# Patient Record
Sex: Male | Born: 1955 | Race: Black or African American | Hispanic: No | Marital: Married | Smoking: Current every day smoker
Health system: Southern US, Community
[De-identification: ages and names within clinical notes are randomized; demographics above are authoritative.]

## PROBLEM LIST (undated history)

## (undated) DIAGNOSIS — B1920 Unspecified viral hepatitis C without hepatic coma: Secondary | ICD-10-CM

## (undated) DIAGNOSIS — N401 Enlarged prostate with lower urinary tract symptoms: Secondary | ICD-10-CM

## (undated) DIAGNOSIS — N138 Other obstructive and reflux uropathy: Secondary | ICD-10-CM

## (undated) DIAGNOSIS — I10 Essential (primary) hypertension: Secondary | ICD-10-CM

## (undated) HISTORY — DX: Benign prostatic hyperplasia with lower urinary tract symptoms: N13.8

## (undated) HISTORY — PX: CATARACT EXTRACTION W/ INTRAOCULAR LENS IMPLANT: SHX1309

## (undated) HISTORY — PX: TONSILLECTOMY: SUR1361

## (undated) HISTORY — DX: Other obstructive and reflux uropathy: N40.1

---

## 2006-09-07 ENCOUNTER — Ambulatory Visit: Payer: Self-pay | Admitting: Endocrinology

## 2009-06-28 ENCOUNTER — Emergency Department (HOSPITAL_COMMUNITY): Admission: EM | Admit: 2009-06-28 | Discharge: 2009-06-28 | Payer: Self-pay | Admitting: Emergency Medicine

## 2009-07-17 ENCOUNTER — Ambulatory Visit: Payer: Self-pay | Admitting: Gastroenterology

## 2009-08-29 ENCOUNTER — Ambulatory Visit (HOSPITAL_COMMUNITY): Admission: RE | Admit: 2009-08-29 | Discharge: 2009-08-29 | Payer: Self-pay | Admitting: Gastroenterology

## 2009-08-29 ENCOUNTER — Encounter (INDEPENDENT_AMBULATORY_CARE_PROVIDER_SITE_OTHER): Payer: Self-pay | Admitting: Radiology

## 2009-09-18 ENCOUNTER — Ambulatory Visit: Payer: Self-pay | Admitting: Gastroenterology

## 2009-12-14 ENCOUNTER — Emergency Department (HOSPITAL_COMMUNITY): Admission: EM | Admit: 2009-12-14 | Discharge: 2009-12-14 | Payer: Self-pay | Admitting: Emergency Medicine

## 2011-02-18 LAB — APTT: aPTT: 30 seconds (ref 24–37)

## 2011-02-18 LAB — CBC
Hemoglobin: 14.5 g/dL (ref 13.0–17.0)
MCHC: 34.1 g/dL (ref 30.0–36.0)
RBC: 4.45 MIL/uL (ref 4.22–5.81)
RDW: 13.6 % (ref 11.5–15.5)

## 2011-02-18 LAB — PROTIME-INR
INR: 1.01 (ref 0.00–1.49)
Prothrombin Time: 13.2 seconds (ref 11.6–15.2)

## 2011-02-21 LAB — POCT I-STAT, CHEM 8
BUN: 7 mg/dL (ref 6–23)
Chloride: 108 mEq/L (ref 96–112)
HCT: 45 % (ref 39.0–52.0)
Sodium: 138 mEq/L (ref 135–145)
TCO2: 24 mmol/L (ref 0–100)

## 2011-02-21 LAB — DIFFERENTIAL
Basophils Absolute: 0.2 10*3/uL — ABNORMAL HIGH (ref 0.0–0.1)
Basophils Relative: 4 % — ABNORMAL HIGH (ref 0–1)
Eosinophils Relative: 2 % (ref 0–5)
Monocytes Absolute: 0.5 10*3/uL (ref 0.1–1.0)
Neutro Abs: 3 10*3/uL (ref 1.7–7.7)

## 2011-02-21 LAB — CBC: MCHC: 33.6 g/dL (ref 30.0–36.0)

## 2012-04-26 ENCOUNTER — Emergency Department (HOSPITAL_COMMUNITY)
Admission: EM | Admit: 2012-04-26 | Discharge: 2012-04-26 | Disposition: A | Discharge status: Home / Self Care | Payer: Self-pay | Attending: Emergency Medicine | Admitting: Emergency Medicine

## 2012-04-26 ENCOUNTER — Emergency Department (HOSPITAL_COMMUNITY): Payer: Self-pay

## 2012-04-26 ENCOUNTER — Encounter (HOSPITAL_COMMUNITY): Payer: Self-pay | Admitting: Emergency Medicine

## 2012-04-26 DIAGNOSIS — R0789 Other chest pain: Secondary | ICD-10-CM | POA: Insufficient documentation

## 2012-04-26 DIAGNOSIS — I10 Essential (primary) hypertension: Secondary | ICD-10-CM | POA: Insufficient documentation

## 2012-04-26 DIAGNOSIS — F17200 Nicotine dependence, unspecified, uncomplicated: Secondary | ICD-10-CM | POA: Insufficient documentation

## 2012-04-26 HISTORY — DX: Essential (primary) hypertension: I10

## 2012-04-26 LAB — DIFFERENTIAL
Basophils Relative: 0 % (ref 0–1)
Eosinophils Absolute: 0.2 10*3/uL (ref 0.0–0.7)
Eosinophils Relative: 2 % (ref 0–5)
Lymphs Abs: 3.4 10*3/uL (ref 0.7–4.0)
Monocytes Relative: 4 % (ref 3–12)
Neutrophils Relative %: 47 % (ref 43–77)

## 2012-04-26 LAB — CBC
MCH: 31.5 pg (ref 26.0–34.0)
MCHC: 34.9 g/dL (ref 30.0–36.0)
Platelets: 192 10*3/uL (ref 150–400)
RBC: 4.38 MIL/uL (ref 4.22–5.81)
RDW: 13 % (ref 11.5–15.5)
WBC: 7.5 10*3/uL (ref 4.0–10.5)

## 2012-04-26 LAB — BASIC METABOLIC PANEL
BUN: 11 mg/dL (ref 6–23)
Calcium: 9.3 mg/dL (ref 8.4–10.5)
GFR calc Af Amer: >90 mL/min (ref >90)
GFR calc non Af Amer: >90 mL/min (ref >90)
Glucose, Bld: 179 mg/dL — ABNORMAL HIGH (ref 70–99)
Potassium: 3.7 mEq/L (ref 3.5–5.1)
Sodium: 136 mEq/L (ref 135–145)

## 2012-04-26 MED ORDER — HYDROCODONE-ACETAMINOPHEN 5-325 MG PO TABS: 2.0000 | ORAL_TABLET | ORAL | Status: AC | PRN

## 2012-04-26 MED ORDER — OXYCODONE-ACETAMINOPHEN 5-325 MG PO TABS
2.0000 | ORAL_TABLET | Freq: Once | ORAL | Status: AC
  Administered 2012-04-26: 2 via ORAL
  Filled 2012-04-26: qty 2

## 2012-04-26 MED ORDER — IBUPROFEN 600 MG PO TABS: 600.0000 mg | ORAL_TABLET | Freq: Four times a day (QID) | ORAL | Status: AC | PRN

## 2012-04-26 MED ORDER — CYCLOBENZAPRINE HCL 10 MG PO TABS: 10.0000 mg | ORAL_TABLET | Freq: Two times a day (BID) | ORAL | Status: AC | PRN

## 2012-04-26 NOTE — ED Notes (Signed)
Pt reports left sided chest pain ongoing x 1 week. Pt reports pain increased last night. Pain radiates down left arm.

## 2012-04-26 NOTE — ED Provider Notes (Signed)
History     CSN: [id number]  Arrival date & time 04/26/12  1412   First MD Initiated Contact with Patient 04/26/12 1804      Chief Complaint  Patient presents with  . Chest Pain     HPI Pt reports left sided chest pain ongoing x 1 week. Pt reports pain increased last night. Pain radiates down left arm. No diaphoresis, n/v.  Pain is made worse when he rolls over in bed or moves his arm.  He also has been complaining of some neck pain and crackling.  Patient has no family history of coronary disease his only known risk factors hypertension and smoking.  Past Medical History  Diagnosis Date  . Hypertension     Past Surgical History  Procedure Date  . Tonsillectomy     History reviewed. No pertinent family history.  History  Substance Use Topics  . Smoking status: Current Everyday Smoker  . Smokeless tobacco: Not on file  . Alcohol Use: Yes     Occasional      Review of Systems  All other systems reviewed and are negative.    Allergies  Review of patient's allergies indicates no known allergies.  Home Medications   Current Outpatient Rx  Name Route Sig Dispense Refill  . AMLODIPINE BESYLATE 5 MG PO TABS Oral Take 5 mg by mouth daily.    . ADULT MULTIVITAMIN W/MINERALS CH Oral Take 1 tablet by mouth daily.    . CYCLOBENZAPRINE HCL 10 MG PO TABS Oral Take 1 tablet (10 mg total) by mouth 2 (two) times daily as needed for muscle spasms. 20 tablet 0  . HYDROCODONE-ACETAMINOPHEN 5-325 MG PO TABS Oral Take 2 tablets by mouth every 4 (four) hours as needed for pain. 10 tablet 0  . IBUPROFEN 600 MG PO TABS Oral Take 1 tablet (600 mg total) by mouth every 6 (six) hours as needed for pain. 30 tablet 0    BP 119/78  Pulse 74  Temp 98.1 F (36.7 C) (Oral)  Resp 20  SpO2 99%  Physical Exam  Nursing note and vitals reviewed. Constitutional: He is oriented to person, place, and time. He appears well-developed and well-nourished. No distress.  HENT:  Head:  Normocephalic and atraumatic.  Eyes: Pupils are equal, round, and reactive to light.  Neck: Normal range of motion.  Cardiovascular: Normal rate and intact distal pulses.         Date: 04/26/2012  Rate: 84  Rhythm: normal sinus rhythm  QRS Axis: normal  Intervals: normal  ST/T Wave abnormalities: normal  Conduction Disutrbances: none  Narrative Interpretation: unremarkable      Pulmonary/Chest: No respiratory distress.  Abdominal: Normal appearance. He exhibits no distension.  Musculoskeletal: Normal range of motion.       Arms:      Pain is reproducible to movement and palpation  Neurological: He is alert and oriented to person, place, and time. No cranial nerve deficit.  Skin: Skin is warm and dry. No rash noted.  Psychiatric: He has a normal mood and affect. His behavior is normal.    ED Course  Procedures (including critical care time)  Labs Reviewed  BASIC METABOLIC PANEL - Abnormal; Notable for the following:    Glucose, Bld 179 (*)     All other components within normal limits  CBC  DIFFERENTIAL  POCT I-STAT TROPONIN I   Dg Chest 2 View  04/26/2012  *RADIOLOGY REPORT*  Clinical Data: Left chest pain.  CHEST - 2 VIEW  Comparison: PA and lateral chest 12/14/2009.  Findings: Lungs are clear.  Heart size is normal.  No pneumothorax or pleural fluid.  No bony abnormality.  IMPRESSION: Normal chest.  Original Report Authenticated By: Bernadene Bell. D'ALESSIO, M.D.     1. Musculoskeletal chest pain       MDM  After examination of the patient's his pain was not typical of ACS.  Most likely it is musculoskeletal in nature.  Plan symptomatic treatment with followup or return as needed       Nelia Shi, MD 04/26/12 [phone]

## 2012-04-26 NOTE — Discharge Instructions (Signed)
Chest Pain, Nonspecific  It is often hard to give a specific diagnosis for the cause of chest pain. There is always a chance that your pain could be related to something serious, like a heart attack or a blood clot in the lungs. You need to follow up with your caregiver for further evaluation. More lab tests or other studies such as X-rays, electrocardiography, stress testing, or cardiac imaging may be needed to find the cause of your pain.  Most of the time, nonspecific chest pain improves within 2 to 3 days with rest and mild pain medicine. For the next few days, avoid physical exertion or activities that bring on pain. Do not smoke. Avoid drinking alcohol. Call your caregiver for routine follow-up as advised.   SEEK IMMEDIATE MEDICAL CARE IF:   You develop increased chest pain or pain that radiates to the arm, neck, jaw, back, or abdomen.   You develop shortness of breath, increased coughing, or you start coughing up blood.   You have severe back or abdominal pain, nausea, or vomiting.   You develop severe weakness, fainting, fever, or chills.  Document Released: 11/01/2005 Document Revised: 10/21/2011 Document Reviewed: 04/21/2007  ExitCare Patient Information 2012 [employer].

## 2012-10-16 ENCOUNTER — Emergency Department (INDEPENDENT_AMBULATORY_CARE_PROVIDER_SITE_OTHER)
Admission: EM | Admit: 2012-10-16 | Discharge: 2012-10-16 | Disposition: A | Discharge status: Home / Self Care | Payer: Self-pay | Source: Home / Self Care

## 2012-10-16 ENCOUNTER — Encounter (HOSPITAL_COMMUNITY): Payer: Self-pay

## 2012-10-16 DIAGNOSIS — L989 Disorder of the skin and subcutaneous tissue, unspecified: Secondary | ICD-10-CM

## 2012-10-16 HISTORY — DX: Unspecified viral hepatitis C without hepatic coma: B19.20

## 2012-10-16 MED ORDER — TOBRAMYCIN 0.3 % OP OINT
TOPICAL_OINTMENT | OPHTHALMIC | Status: AC
  Filled 2012-10-16: qty 3.5

## 2012-10-16 NOTE — ED Provider Notes (Signed)
CC:   Mole on head  HPI Pt is a 56 y.o. male with mole on head for 3 months, getting bigger. Hit head on car door before it happened - wasn't bleeding at time.  Developed a scab on it after a few weeks. Has been picking at scab over time, causing bleeding and sore/raw feeling.  Gets headaches on top of scalp when "messing" with it. No hx skin cancer personally or in family. Wants to have lesion cut off.    Past Medical History  Diagnosis Date  . Hypertension   . Hepatitis C last year    Has appt for treatment at [facility] in January   Past Surgical History  Procedure Date  . Tonsillectomy    MEDS:  5 mg amlodipine  ALLER:  None  Smoking:  2 ppd.  ALC:  Occasionally (once a month) DRUGS:  None  ROS:  Review of Systems - General ROS: negative for - chills, fatigue, fever, malaise, night sweats or weight loss Ophthalmic ROS: negative for - visual disturbance or change ENT ROS: negative for - tinnitus, vertigo or visual changes Respiratory ROS: negative for - cough or shortness of breath Cardiovascular ROS: negative for - loss of consciousness or shortness of breath Neurological ROS: negative for - confusion, dizziness, seizures or weakness Dermatological ROS: negative for eczema, pruritus and psoriasis  PHYS EXAM:  Filed Vitals:   10/16/12 1844  BP: 121/78  Pulse: 82  Temp: 98.8 F (37.1 C)  Resp: 16   Physical Examination: General appearance - alert, well appearing, and in no distress, oriented to person, place, and time and normal appearing weight Mental status - normal mood, behavior, speech, dress, motor activity, and thought processes Eyes - PERRL, EOMI, conjunctiva clear Mouth - mucous membranes moist, pharynx normal without lesions Neck - supple, no significant adenopathy Chest - clear to auscultation, no wheezes, rales or rhonchi, symmetric air entry Heart - normal rate, regular rhythm, normal S1, S2, no murmurs, rubs, clicks or gallops Extremities - no pedal edema  noted Skin - 1 cm heaped-up, cauliflower-like, flesh-colored lesion on scalp, mid-line, parietal area. Non tender. Wide base/stalk. No erythema or bleeding. HEAD:  NCAT, no pre or post-auricular adenopathy.  NEURO:  No focal deficits  A/P 55 y.o. male with scalp lesion. Likely wart vs keloid but cannot rule out cancer. Patient advised to establish care with family physician at underserved clinic or [facility] for biopsy/excision of lesion for pathology.   Napoleon Form, MD  Napoleon Form, MD 10/16/12 [phone]

## 2012-10-16 NOTE — ED Notes (Signed)
C/o mole on top of head, changing in color and size has been there for approx 3 mos

## 2013-09-30 ENCOUNTER — Emergency Department (HOSPITAL_COMMUNITY): Payer: BC Managed Care – PPO

## 2013-09-30 ENCOUNTER — Encounter (HOSPITAL_COMMUNITY): Payer: Self-pay | Admitting: Emergency Medicine

## 2013-09-30 ENCOUNTER — Emergency Department (HOSPITAL_COMMUNITY)
Admission: EM | Admit: 2013-09-30 | Discharge: 2013-09-30 | Disposition: A | Discharge status: Home / Self Care | Payer: BC Managed Care – PPO | Attending: Emergency Medicine | Admitting: Emergency Medicine

## 2013-09-30 DIAGNOSIS — B1920 Unspecified viral hepatitis C without hepatic coma: Secondary | ICD-10-CM | POA: Insufficient documentation

## 2013-09-30 DIAGNOSIS — W2209XA Striking against other stationary object, initial encounter: Secondary | ICD-10-CM | POA: Insufficient documentation

## 2013-09-30 DIAGNOSIS — R609 Edema, unspecified: Secondary | ICD-10-CM | POA: Insufficient documentation

## 2013-09-30 DIAGNOSIS — Y929 Unspecified place or not applicable: Secondary | ICD-10-CM | POA: Insufficient documentation

## 2013-09-30 DIAGNOSIS — S92919A Unspecified fracture of unspecified toe(s), initial encounter for closed fracture: Secondary | ICD-10-CM | POA: Insufficient documentation

## 2013-09-30 DIAGNOSIS — S92912A Unspecified fracture of left toe(s), initial encounter for closed fracture: Secondary | ICD-10-CM

## 2013-09-30 DIAGNOSIS — I10 Essential (primary) hypertension: Secondary | ICD-10-CM | POA: Insufficient documentation

## 2013-09-30 DIAGNOSIS — Z79899 Other long term (current) drug therapy: Secondary | ICD-10-CM | POA: Insufficient documentation

## 2013-09-30 DIAGNOSIS — Y9389 Activity, other specified: Secondary | ICD-10-CM | POA: Insufficient documentation

## 2013-09-30 DIAGNOSIS — F17200 Nicotine dependence, unspecified, uncomplicated: Secondary | ICD-10-CM | POA: Insufficient documentation

## 2013-09-30 MED ORDER — OXYCODONE-ACETAMINOPHEN 5-325 MG PO TABS: 2.0000 | ORAL_TABLET | ORAL | Status: DC | PRN

## 2013-09-30 MED ORDER — OXYCODONE-ACETAMINOPHEN 5-325 MG PO TABS: 2.0000 | ORAL_TABLET | Freq: Once | ORAL | Status: DC

## 2013-09-30 NOTE — ED Notes (Signed)
Pt c/o injury to left pinky toe 1 week ago. Pt reports hit toe on door. Pt reports area swollen. Pt has been soaking his foot in epsom salt with warm water. Pt reports pain radiates up to left ankle. Pt ambulatory in triage.

## 2013-09-30 NOTE — ED Provider Notes (Signed)
CSN: [id number]     Arrival date & time 09/30/13  1131 History  This chart was scribed for non-physician practitioner, Irish Elders, NP working with Bonnita Levan. Bernette Mayers, MD by Greggory Stallion, ED scribe. This patient was seen in room TR10C/TR10C and the patient's care was started at 12:43 PM.   Chief Complaint  Patient presents with  . Toe Injury   The history is provided by the patient. No language interpreter was used.   HPI Comments: Patrick Chavez is a 57 y.o. male who presents to the Emergency Department complaining of left pinky toe injury that occurred one week ago when he hit his toe on the door. Pt has sudden onset, constant left pinky toe pain that radiates into his left ankle with associated toe swelling. He has been doing epsom salt soaks with little relief. Denies numbness and tingling.   Past Medical History  Diagnosis Date  . Hypertension   . Hepatitis C last year    Has appt for treatment at [facility] in January   Past Surgical History  Procedure Laterality Date  . Tonsillectomy     No family history on file. History  Substance Use Topics  . Smoking status: Current Every Day Smoker  . Smokeless tobacco: Not on file  . Alcohol Use: No    Review of Systems  Musculoskeletal: Positive for arthralgias and joint swelling.  Neurological: Negative for numbness.  All other systems reviewed and are negative.   Allergies  Review of patient's allergies indicates no known allergies.  Home Medications   Current Outpatient Rx  Name  Route  Sig  Dispense  Refill  . amLODipine (NORVASC) 5 MG tablet   Oral   Take 5 mg by mouth daily.         . Multiple Vitamin (MULTIVITAMIN WITH MINERALS) TABS   Oral   Take 1 tablet by mouth daily.          BP 129/80  Pulse 79  Temp(Src) 97.9 F (36.6 C) (Oral)  Resp 18  Ht 6\' 3"  (1.905 m)  Wt 210 lb (95.255 kg)  BMI 26.25 kg/m2  SpO2 98%  Physical Exam  Nursing note and vitals reviewed. Constitutional: He is oriented to  person, place, and time. He appears well-developed and well-nourished. No distress.  HENT:  Head: Normocephalic and atraumatic.  Eyes: EOM are normal.  Neck: Neck supple. No tracheal deviation present.  Cardiovascular: Normal rate.   Pulmonary/Chest: Effort normal. No respiratory distress.  Musculoskeletal: Normal range of motion.  Left foot fifth digit slight edema. Has been ambulatory and wearing shoe.   Neurological: He is alert and oriented to person, place, and time.  Good sensation. No numbness or tingling.   Skin: Skin is warm and dry.  Psychiatric: He has a normal mood and affect. His behavior is normal.    ED Course  Procedures (including critical care time)  DIAGNOSTIC STUDIES: Oxygen Saturation is 98% on RA, normal by my interpretation.    COORDINATION OF CARE: 12:44 PM-Discussed treatment plan which includes xray and pain medication with pt at bedside and pt agreed to plan.  1:10 PM-Alerted pt that xray was positive for a fracture. He will be given a boot and pain medication to go home with.   Labs Review Labs Reviewed - No data to display Imaging Review Dg Foot Complete Left  09/30/2013   CLINICAL DATA:  Fall 1 week ago with pain centered at 5th digit.  EXAM: LEFT FOOT - COMPLETE 3+ VIEW  COMPARISON:  None.  FINDINGS: Transverse fracture involving the proximal phalanx of the the 5th digit. This may have minimal comminution. No intra-articular extension.  IMPRESSION: Fifth proximal phalangeal fracture.   Electronically Signed   By: Jeronimo Greaves M.D.   On: 09/30/2013 12:53    EKG Interpretation   None       MDM   1. Toe fracture, left, closed, initial encounter     Injury to left, 5th metatarsal approx 1 week ago. X-ray; 5th metatarsal fracture, non-displaced. RICE treatment, percocet for increased pain. Ortho boot given to wear as needed. Pt reports that he has already had some improvement prior to today's visit. Plan of care discussed with pt and he  agrees.  I personally performed the services described in this documentation, which was scribed in my presence. The recorded information has been reviewed and is accurate.   Irish Elders, NP 09/30/13 1540

## 2013-10-02 NOTE — ED Provider Notes (Signed)
Medical screening examination/treatment/procedure(s) were performed by non-physician practitioner and as supervising physician I was immediately available for consultation/collaboration.  EKG Interpretation   None         Charles B. Sheldon, MD 10/02/13 0700 

## 2015-04-05 ENCOUNTER — Emergency Department (HOSPITAL_COMMUNITY)
Admission: EM | Admit: 2015-04-05 | Discharge: 2015-04-05 | Disposition: A | Discharge status: Home / Self Care | Payer: BC Managed Care – PPO | Attending: Emergency Medicine | Admitting: Emergency Medicine

## 2015-04-05 ENCOUNTER — Encounter (HOSPITAL_COMMUNITY): Payer: Self-pay | Admitting: *Deleted

## 2015-04-05 ENCOUNTER — Emergency Department (HOSPITAL_COMMUNITY): Payer: BC Managed Care – PPO

## 2015-04-05 DIAGNOSIS — Z720 Tobacco use: Secondary | ICD-10-CM | POA: Insufficient documentation

## 2015-04-05 DIAGNOSIS — Y9389 Activity, other specified: Secondary | ICD-10-CM | POA: Diagnosis not present

## 2015-04-05 DIAGNOSIS — Z79899 Other long term (current) drug therapy: Secondary | ICD-10-CM | POA: Diagnosis not present

## 2015-04-05 DIAGNOSIS — S8992XA Unspecified injury of left lower leg, initial encounter: Secondary | ICD-10-CM | POA: Diagnosis not present

## 2015-04-05 DIAGNOSIS — S39012A Strain of muscle, fascia and tendon of lower back, initial encounter: Secondary | ICD-10-CM

## 2015-04-05 DIAGNOSIS — I10 Essential (primary) hypertension: Secondary | ICD-10-CM | POA: Diagnosis not present

## 2015-04-05 DIAGNOSIS — Y92410 Unspecified street and highway as the place of occurrence of the external cause: Secondary | ICD-10-CM | POA: Diagnosis not present

## 2015-04-05 DIAGNOSIS — Z8619 Personal history of other infectious and parasitic diseases: Secondary | ICD-10-CM | POA: Diagnosis not present

## 2015-04-05 DIAGNOSIS — Y998 Other external cause status: Secondary | ICD-10-CM | POA: Diagnosis not present

## 2015-04-05 DIAGNOSIS — S3992XA Unspecified injury of lower back, initial encounter: Secondary | ICD-10-CM | POA: Diagnosis present

## 2015-04-05 MED ORDER — METHOCARBAMOL 500 MG PO TABS: 500.0000 mg | ORAL_TABLET | Freq: Two times a day (BID) | ORAL | Status: DC

## 2015-04-05 MED ORDER — PREDNISONE 10 MG PO TABS: ORAL_TABLET | ORAL | Status: DC

## 2015-04-05 MED ORDER — IBUPROFEN 800 MG PO TABS: 800.0000 mg | ORAL_TABLET | Freq: Three times a day (TID) | ORAL | Status: DC
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
## 2015-04-05 NOTE — Discharge Instructions (Signed)
Back Pain, Adult Low back pain is very common. About 1 in 5 people have back pain.The cause of low back pain is rarely dangerous. The pain often gets better over time.About half of people with a sudden onset of back pain feel better in just 2 weeks. About 8 in 10 people feel better by 6 weeks.  CAUSES Some common causes of back pain include:  Strain of the muscles or ligaments supporting the spine.  Wear and tear (degeneration) of the spinal discs.  Arthritis.  Direct injury to the back. DIAGNOSIS Most of the time, the direct cause of low back pain is not known.However, back pain can be treated effectively even when the exact cause of the pain is unknown.Answering your caregiver's questions about your overall health and symptoms is one of the most accurate ways to make sure the cause of your pain is not dangerous. If your caregiver needs more information, he or she may order lab work or imaging tests (X-rays or MRIs).However, even if imaging tests show changes in your back, this usually does not require surgery. HOME CARE INSTRUCTIONS For many people, back pain returns.Since low back pain is rarely dangerous, it is often a condition that people can learn to manageon their own.   Remain active. It is stressful on the back to sit or stand in one place. Do not sit, drive, or stand in one place for more than 30 minutes at a time. Take short walks on level surfaces as soon as pain allows.Try to increase the length of time you walk each day.  Do not stay in bed.Resting more than 1 or 2 days can delay your recovery.  Do not avoid exercise or work.Your body is made to move.It is not dangerous to be active, even though your back may hurt.Your back will likely heal faster if you return to being active before your pain is gone.  Pay attention to your body when you bend and lift. Many people have less discomfortwhen lifting if they bend their knees, keep the load close to their bodies,and  avoid twisting. Often, the most comfortable positions are those that put less stress on your recovering back.  Find a comfortable position to sleep. Use a firm mattress and lie on your side with your knees slightly bent. If you lie on your back, put a pillow under your knees.  Only take over-the-counter or prescription medicines as directed by your caregiver. Over-the-counter medicines to reduce pain and inflammation are often the most helpful.Your caregiver may prescribe muscle relaxant drugs.These medicines help dull your pain so you can more quickly return to your normal activities and healthy exercise.  Put ice on the injured area.  Put ice in a plastic bag.  Place a towel between your skin and the bag.  Leave the ice on for 15-20 minutes, 03-04 times a day for the first 2 to 3 days. After that, ice and heat may be alternated to reduce pain and spasms.  Ask your caregiver about trying back exercises and gentle massage. This may be of some benefit.  Avoid feeling anxious or stressed.Stress increases muscle tension and can worsen back pain.It is important to recognize when you are anxious or stressed and learn ways to manage it.Exercise is a great option. SEEK MEDICAL CARE IF:  You have pain that is not relieved with rest or medicine.  You have pain that does not improve in 1 week.  You have new symptoms.  You are generally not feeling well. SEEK   IMMEDIATE MEDICAL CARE IF:   You have pain that radiates from your back into your legs.  You develop new bowel or bladder control problems.  You have unusual weakness or numbness in your arms or legs.  You develop nausea or vomiting.  You develop abdominal pain.  You feel faint. Document Released: 11/01/2005 Document Revised: 05/02/2012 Document Reviewed: 03/05/2014 ExitCare Patient Information 2015 [employer]. This information is not intended to replace advice given to you by your health care provider. Make sure you  discuss any questions you have with your health care provider.  
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
## 2015-09-15 ENCOUNTER — Observation Stay (HOSPITAL_COMMUNITY): Payer: BC Managed Care – PPO

## 2015-09-15 ENCOUNTER — Emergency Department (HOSPITAL_COMMUNITY): Payer: BC Managed Care – PPO

## 2015-09-15 ENCOUNTER — Encounter (HOSPITAL_COMMUNITY): Payer: Self-pay | Admitting: Emergency Medicine

## 2015-09-15 ENCOUNTER — Observation Stay (HOSPITAL_COMMUNITY)
Admission: EM | Admit: 2015-09-15 | Discharge: 2015-09-16 | Disposition: A | Discharge status: Home / Self Care | Payer: BC Managed Care – PPO | Attending: Internal Medicine | Admitting: Internal Medicine

## 2015-09-15 DIAGNOSIS — G459 Transient cerebral ischemic attack, unspecified: Principal | ICD-10-CM

## 2015-09-15 DIAGNOSIS — R29810 Facial weakness: Secondary | ICD-10-CM | POA: Diagnosis not present

## 2015-09-15 DIAGNOSIS — R200 Anesthesia of skin: Secondary | ICD-10-CM | POA: Insufficient documentation

## 2015-09-15 DIAGNOSIS — R208 Other disturbances of skin sensation: Secondary | ICD-10-CM

## 2015-09-15 DIAGNOSIS — F17210 Nicotine dependence, cigarettes, uncomplicated: Secondary | ICD-10-CM | POA: Diagnosis not present

## 2015-09-15 DIAGNOSIS — R519 Headache, unspecified: Secondary | ICD-10-CM

## 2015-09-15 DIAGNOSIS — I10 Essential (primary) hypertension: Secondary | ICD-10-CM | POA: Diagnosis not present

## 2015-09-15 DIAGNOSIS — E785 Hyperlipidemia, unspecified: Secondary | ICD-10-CM | POA: Diagnosis not present

## 2015-09-15 DIAGNOSIS — R51 Headache: Secondary | ICD-10-CM

## 2015-09-15 DIAGNOSIS — B1920 Unspecified viral hepatitis C without hepatic coma: Secondary | ICD-10-CM | POA: Diagnosis not present

## 2015-09-15 LAB — RAPID URINE DRUG SCREEN, HOSP PERFORMED
Amphetamines: NOT DETECTED
Barbiturates: NOT DETECTED
Benzodiazepines: NOT DETECTED
Cocaine: NOT DETECTED
OPIATES: NOT DETECTED
TETRAHYDROCANNABINOL: NOT DETECTED

## 2015-09-15 LAB — DIFFERENTIAL
Basophils Absolute: 0 10*3/uL (ref 0.0–0.1)
Basophils Relative: 0 %
EOS ABS: 0.1 10*3/uL (ref 0.0–0.7)
EOS PCT: 1 %
LYMPHS ABS: 3.2 10*3/uL (ref 0.7–4.0)
LYMPHS PCT: 35 %
Monocytes Absolute: 0.7 10*3/uL (ref 0.1–1.0)
Monocytes Relative: 7 %
Neutro Abs: 5.1 10*3/uL (ref 1.7–7.7)
Neutrophils Relative %: 57 %

## 2015-09-15 LAB — COMPREHENSIVE METABOLIC PANEL
ALK PHOS: 58 U/L (ref 38–126)
ALT: 24 U/L (ref 17–63)
ANION GAP: 9 (ref 5–15)
AST: 27 U/L (ref 15–41)
Albumin: 4 g/dL (ref 3.5–5.0)
BILIRUBIN TOTAL: 0.7 mg/dL (ref 0.3–1.2)
BUN: 11 mg/dL (ref 6–20)
CO2: 27 mmol/L (ref 22–32)
Calcium: 9.9 mg/dL (ref 8.9–10.3)
Chloride: 103 mmol/L (ref 101–111)
Creatinine, Ser: 1.14 mg/dL (ref 0.61–1.24)
GFR calc non Af Amer: >60 mL/min (ref >60)
Glucose, Bld: 113 mg/dL — ABNORMAL HIGH (ref 65–99)
Potassium: 3.9 mmol/L (ref 3.5–5.1)
Sodium: 139 mmol/L (ref 135–145)
TOTAL PROTEIN: 7.7 g/dL (ref 6.5–8.1)

## 2015-09-15 LAB — CBC
HCT: 38.8 % — ABNORMAL LOW (ref 39.0–52.0)
HCT: 38.9 % — ABNORMAL LOW (ref 39.0–52.0)
HEMOGLOBIN: 13.2 g/dL (ref 13.0–17.0)
Hemoglobin: 13 g/dL (ref 13.0–17.0)
MCH: 31.1 pg (ref 26.0–34.0)
MCH: 30.8 pg (ref 26.0–34.0)
MCHC: 34 g/dL (ref 30.0–36.0)
MCHC: 33.4 g/dL (ref 30.0–36.0)
MCV: 91.5 fL (ref 78.0–100.0)
MCV: 92.2 fL (ref 78.0–100.0)
PLATELETS: 236 10*3/uL (ref 150–400)
Platelets: 232 10*3/uL (ref 150–400)
RBC: 4.24 MIL/uL (ref 4.22–5.81)
RBC: 4.22 MIL/uL (ref 4.22–5.81)
RDW: 13.3 % (ref 11.5–15.5)
RDW: 13.5 % (ref 11.5–15.5)
WBC: 9.1 10*3/uL (ref 4.0–10.5)
WBC: 8.7 10*3/uL (ref 4.0–10.5)

## 2015-09-15 LAB — URINALYSIS, ROUTINE W REFLEX MICROSCOPIC
BILIRUBIN URINE: NEGATIVE
Glucose, UA: NEGATIVE mg/dL
Hgb urine dipstick: NEGATIVE
Ketones, ur: NEGATIVE mg/dL
Leukocytes, UA: NEGATIVE
NITRITE: NEGATIVE
Protein, ur: NEGATIVE mg/dL
SPECIFIC GRAVITY, URINE: 1.019 (ref 1.005–1.030)
Urobilinogen, UA: 1 mg/dL (ref 0.0–1.0)
pH: 7 (ref 5.0–8.0)

## 2015-09-15 LAB — I-STAT CHEM 8, ED
BUN: 14 mg/dL (ref 6–20)
CALCIUM ION: 1.23 mmol/L (ref 1.12–1.23)
Chloride: 103 mmol/L (ref 101–111)
Creatinine, Ser: 1.2 mg/dL (ref 0.61–1.24)
Glucose, Bld: 110 mg/dL — ABNORMAL HIGH (ref 65–99)
HCT: 46 % (ref 39.0–52.0)
HEMOGLOBIN: 15.6 g/dL (ref 13.0–17.0)
Potassium: 3.9 mmol/L (ref 3.5–5.1)
SODIUM: 142 mmol/L (ref 135–145)
TCO2: 24 mmol/L (ref 0–100)

## 2015-09-15 LAB — PROTIME-INR
INR: 1.11 (ref 0.00–1.49)
Prothrombin Time: 14.5 seconds (ref 11.6–15.2)

## 2015-09-15 LAB — ETHANOL: Alcohol, Ethyl (B): <5 mg/dL (ref <5)

## 2015-09-15 LAB — I-STAT TROPONIN, ED: Troponin i, poc: 0 ng/mL (ref 0.00–0.08)

## 2015-09-15 LAB — APTT: aPTT: 30 seconds (ref 24–37)

## 2015-09-15 MED ORDER — SENNOSIDES-DOCUSATE SODIUM 8.6-50 MG PO TABS
1.0000 | ORAL_TABLET | Freq: Every evening | ORAL | Status: DC | PRN
Start: 2015-09-15 — End: 2015-09-16

## 2015-09-15 MED ORDER — ENOXAPARIN SODIUM 40 MG/0.4ML ~~LOC~~ SOLN: 40.0000 mg | SUBCUTANEOUS | Status: DC

## 2015-09-15 MED ORDER — OXYCODONE HCL 5 MG PO TABS: 5.0000 mg | ORAL_TABLET | Freq: Four times a day (QID) | ORAL | Status: DC | PRN

## 2015-09-15 MED ORDER — ASPIRIN 325 MG PO TABS
325.0000 mg | ORAL_TABLET | Freq: Every day | ORAL | Status: DC
  Administered 2015-09-16: 325 mg via ORAL
  Filled 2015-09-15: qty 1

## 2015-09-15 MED ORDER — STROKE: EARLY STAGES OF RECOVERY BOOK
Freq: Once | Status: AC
  Administered 2015-09-16: 08:00:00
  Filled 2015-09-15: qty 1

## 2015-09-15 NOTE — ED Notes (Addendum)
Attempted to call report - placed on hold 6 minutes

## 2015-09-15 NOTE — ED Notes (Signed)
Admitting at bedside 

## 2015-09-15 NOTE — ED Notes (Signed)
edp at bedside to evaluate. Slight L arm and L leg weakness

## 2015-09-15 NOTE — H&P (Signed)
[facility] History and Physical  Patrick Chavez AVW:[id number] DOB: Mar 16, 1956 DOA: 09/15/2015  Referring physician: ER  PCP: Default, Provider, MD   Chief Complaint: Headache and left-sided facial numbness  HPI:  59 year old male with a history of hypertension, nicotine dependence, hepatitis C, presents with headache and left-sided facial numbness and left upper extremity weakness, onset at 5:30 PM today. Patient also described a throbbing left frontal headache associated with the symptoms. His symptoms were also associated with imbalance. Denied any blurry vision or slurred speech. Found to have subtle left-sided facial droop upon exam but decreased sensation of the left side of the face.Also 4/5 strength in left hand grasp compared to 5/5 strength R hand grasp. Bilateral lower extremity strength normal. Nl gait. Given possible facial droop, L hand weakness, code stroke activated. Patient seen by neurology. To scan of the head and MRI of the brain were negative for acute CVA. Neurology recommended admission for a full stroke workup.      Review of Systems: negative for the following  General ROS: negative for - chills, fatigue, fever, night sweats, weight gain or weight loss Psychological ROS: negative for - behavioral disorder, hallucinations, memory difficulties, mood swings or suicidal ideation Ophthalmic ROS: negative for - blurry vision, double vision, eye pain or loss of vision ENT ROS: negative for - epistaxis, nasal discharge, oral lesions, sore throat, tinnitus or vertigo Allergy and Immunology ROS: negative for - hives or itchy/watery eyes Hematological and Lymphatic ROS: negative for - bleeding problems, bruising or swollen lymph nodes Endocrine ROS: negative for - galactorrhea, hair pattern changes, polydipsia/polyuria or temperature intolerance Respiratory ROS: negative for - cough, hemoptysis, shortness of breath or wheezing Cardiovascular ROS: negative for - chest  pain, dyspnea on exertion, edema or irregular heartbeat Gastrointestinal ROS: negative for - abdominal pain, diarrhea, hematemesis, nausea/vomiting or stool incontinence Genito-Urinary ROS: negative for - dysuria, hematuria, incontinence or urinary frequency/urgency Musculoskeletal ROS: negative for - joint swelling or muscular weakness Neurological ROS: as noted in HPI Dermatological ROS: negative for rash and skin lesion changes     Past Medical History  Diagnosis Date  . Hypertension   . Hepatitis C last year    Has appt for treatment at [facility] in January     Past Surgical History  Procedure Laterality Date  . Tonsillectomy        Social History:  reports that he has been smoking.  He has never used smokeless tobacco. He reports that he does not drink alcohol or use illicit drugs.    No Known Allergies      FAMILY HISTORY   Mother-pancreatic cancer Father-stomach cancer Son has migraine headaches  Prior to Admission medications   Medication Sig Start Date End Date Taking? Authorizing Provider  amLODipine (NORVASC) 5 MG tablet Take 5 mg by mouth daily.   Yes Historical Provider, MD  Multiple Vitamin (MULTIVITAMIN WITH MINERALS) TABS Take 1 tablet by mouth daily.   Yes Historical Provider, MD  ibuprofen (ADVIL,MOTRIN) 800 MG tablet Take 1 tablet (800 mg total) by mouth 3 (three) times daily. Patient not taking: Reported on 09/15/2015 04/05/15   Elson Areas, PA-C  methocarbamol (ROBAXIN) 500 MG tablet Take 1 tablet (500 mg total) by mouth 2 (two) times daily. Patient not taking: Reported on 09/15/2015 04/05/15   Elson Areas, PA-C  oxyCODONE-acetaminophen (PERCOCET/ROXICET) 5-325 MG per tablet Take 2 tablets by mouth every 4 (four) hours as needed for severe pain. Patient not taking: Reported on 09/15/2015 09/30/13   Tresa Endo  Dan Humphreys, NP  predniSONE (DELTASONE) 10 MG tablet 6,5,4,3,2,1,taper Patient not taking: Reported on 09/15/2015 04/05/15   Elson Areas, [place]      Physical Exam: Filed Vitals:   09/15/15 2145 09/15/15 2200 09/15/15 2215 09/15/15 2230  BP: 115/59 107/59 104/60 110/62  Pulse: 66 71 76 78  Temp:      TempSrc:      Resp: Height:      Weight:      SpO2: 96% 97% 97% 96%     Constitutional: Vital signs reviewed. Patient is a well-developed and well-nourished in no acute distress and cooperative with exam. Alert and oriented x3.  Head: Normocephalic and atraumatic  Ear: TM normal bilaterally  Mouth: no erythema or exudates, MMM  Eyes: PERRL, EOMI, conjunctivae normal, No scleral icterus.  Neck: Supple, Trachea midline normal ROM, No JVD, mass, thyromegaly, or carotid bruit present.  Cardiovascular: RRR, S1 normal, S2 normal, no MRG, pulses symmetric and intact bilaterally  Pulmonary/Chest: CTAB, no wheezes, rales, or rhonchi  Abdominal: Soft. Non-tender, non-distended, bowel sounds are normal, no masses, organomegaly, or guarding present.  GU: no CVA tenderness Musculoskeletal: No joint deformities, erythema, or stiffness, ROM full and no nontender Ext: no edema and no cyanosis, pulses palpable bilaterally (DP and PT)  Hematology: no cervical, inginal, or axillary adenopathy.  Neurological: A&O x3, Motor: 5/5 bilaterally with normal tone and bulk Sensory: Slightly reduced perception of tactile sensation over left upper extremity compared to the right.Skin: Warm, dry and intact. No rash, cyanosis, or clubbing.  Psychiatric: Normal mood and affect. speech and behavior is normal. Judgment and thought content normal. Cognition and memory are normal.      Data Review   Micro Results No results found for this or any previous visit (from the past 240 hour(s)).  Radiology Reports Ct Head Wo Contrast  09/15/2015  CLINICAL DATA:  Acute onset of headache, left facial numbness, blurred vision and loss of balance while at [employer] earlier today approximately 1730 hr. Code stroke. EXAM: CT HEAD WITHOUT CONTRAST  TECHNIQUE: Contiguous axial images were obtained from the base of the skull through the vertex without intravenous contrast. COMPARISON:  06/28/2009. FINDINGS: Ventricular system normal in size and appearance for age. No mass lesion. No midline shift. No acute hemorrhage or hematoma. No extra-axial fluid collections. No evidence of acute infarction. No skull fracture or other focal osseous abnormality involving the skull. Small mucous retention cysts or polyps in the right maxillary sinus. Remaining visualized paranasal sinuses, bilateral mastoid air cells and bilateral middle ear cavities well aerated. IMPRESSION: 1. Normal intracranially. 2. Mild chronic right maxillary sinus disease. I telephoned these results at the time of interpretation on 09/15/2015 at 6:51 pm to Dr. Roseanne Reno, who verbally acknowledged these results. Electronically Signed   By: Hulan Saas M.D.   On: 09/15/2015 18:52   Mr Brain Wo Contrast  09/15/2015  CLINICAL DATA:  59 year old male with hypertension hepatitis-C presenting with headache followed by a numbness left side of head and face. Subsequent encounter. EXAM: MRI HEAD WITHOUT CONTRAST TECHNIQUE: Multiplanar, multiecho pulse sequences of the brain and surrounding structures were obtained without intravenous contrast. COMPARISON:  09/15/2015. FINDINGS: No acute infarct. No intracranial hemorrhage. No hydrocephalus. No intracranial mass lesion noted on this unenhanced exam. Major intracranial vascular structures are patent. Ectatic vertebral arteries and basilar artery. Mild transverse ligament hypertrophy. Cervical medullary junction, pituitary region, pineal region and orbital structures unremarkable. Polypoid opacification superior anterior right maxillary sinus. IMPRESSION: No acute  infarct. Electronically Signed   By: Lacy DuverneySteven  Olson M.D.   On: 09/15/2015 21:03     CBC  Recent Labs Lab 09/15/15 1842  WBC 9.1  HGB 13.2  15.6  HCT 38.8*  46.0  PLT 232  MCV 91.5   MCH 31.1  MCHC 34.0  RDW 13.3  LYMPHSABS 3.2  MONOABS 0.7  EOSABS 0.1  BASOSABS 0.0    Chemistries   Recent Labs Lab 09/15/15 1842  NA 139  142  K 3.9  3.9  CL 103  103  CO2 27  GLUCOSE 113*  110*  BUN 11  14  CREATININE 1.14  1.20  CALCIUM 9.9  AST 27  ALT 24  ALKPHOS 58  BILITOT 0.7   ------------------------------------------------------------------------------------------------------------------ estimated creatinine clearance is 87.3 mL/min (by C-G formula based on Cr of 1.2). ------------------------------------------------------------------------------------------------------------------ No results for input(s): HGBA1C in the last 72 hours. ------------------------------------------------------------------------------------------------------------------ No results for input(s): CHOL, HDL, LDLCALC, TRIG, CHOLHDL, LDLDIRECT in the last 72 hours. ------------------------------------------------------------------------------------------------------------------ No results for input(s): TSH, T4TOTAL, T3FREE, THYROIDAB in the last 72 hours.  Invalid input(s): FREET3 ------------------------------------------------------------------------------------------------------------------ No results for input(s): VITAMINB12, FOLATE, FERRITIN, TIBC, IRON, RETICCTPCT in the last 72 hours.  Coagulation profile  Recent Labs Lab 09/15/15 1842  INR 1.11    No results for input(s): DDIMER in the last 72 hours.  Cardiac Enzymes No results for input(s): CKMB, TROPONINI, MYOGLOBIN in the last 168 hours.  Invalid input(s): CK ------------------------------------------------------------------------------------------------------------------ Invalid input(s): POCBNP   CBG: No results for input(s): GLUCAP in the last 168 hours.     EKG: Independently reviewed. Normal sinus rhythm   Assessment/Plan Active Problems: Probable right subcortical small vessel TIA.-Seen by  neurology, recommend full TIA workup including MRI/MRA of the brain, echocardiogram, carotid Doppler, PT/OT evaluation, telemetry monitoring, aspirin 325 mg.  Nicotine dependence-nicotine cessation counseling done-will provide a nicotine patch   Hypertension-continue Norvasc, blood pressure slightly elevated into the 150s, avoid aggressive correction.  Acute frontal headache likely secondary to #1, will use when necessary oxycodone  Code Status:   full Family Communication: bedside Disposition Plan: admit   Total time spent 55 minutes.Greater than 50% of this time was spent in counseling, explanation of diagnosis, planning of further management, and coordination of care  Hebrew Home And Hospital IncBROL,Deltha Bernales [facility] Pager [phone]  If 7PM-7AM, please contact night-coverage [email or web address] Password [email or web address] 09/15/2015, 10:50 PM

## 2015-09-15 NOTE — ED Notes (Signed)
Code Stroke activated @ [phone]

## 2015-09-15 NOTE — ED Notes (Signed)
Patient transported to MRI - RN to accompany

## 2015-09-15 NOTE — ED Provider Notes (Signed)
MSE was initiated and I personally evaluated the patient and placed orders (if any) at  6:27 PM on September 15, 2015.  The patient appears stable so that the remainder of the MSE may be completed by another provider.  Patient here with headache, L facial numbness, weakness. Onset around 5:30 PM today. Had acute onset of left-sided headaches. Also associated with some left facial numbness and [facility] Has some blurry vision as well. On exam, patient has subtle left facial droop, decreased sensation left side of the face. Also 4/5 strength in left hand grasp compared to 5/5 strength R hand grasp. Bilateral lower extremity strength normal. Nl gait. Given possible facial droop, L hand weakness, code stroke activated.   Patrick Canalavid H Nejla Reasor, MD 09/15/15 [phone]

## 2015-09-15 NOTE — ED Notes (Signed)
Patient transported to XR. 

## 2015-09-15 NOTE — Code Documentation (Signed)
Patient stated that his HA started at 1730,  He states that he has had a number of HAs recently.  Today he also had some facial numbness which he has not experienced before.  HA 9/10.  NIHSS 1 mild sensory loss on left.  Dr Roseanne RenoStewart at bedside to assess patient.  He remains in the TPA window until 2200.  RN to call if patient worsens.

## 2015-09-15 NOTE — Consult Note (Signed)
Admission H&P    Chief Complaint: Headache with numbness involving left side of the face and equivocal left extremity weakness.  HPI: Patrick Chavez is an 59 y.o. male history of hypertension and hepatitis C as well as cigarette smoking presenting with onset of headache at around 5:30 PM today she followed shortly by numbness involving the left side of his head and face. He noticed no changes in speech. Initial evaluation in the ED indicated possible side weakness involving his left upper extremity, as well as mild left facial droop. Code stroke was activated. CT scan of his head was obtained which showed no acute intracranial abnormality. NIH stroke score was 1 with residual mild left-sided numbness.  LSN: 5:30 PM on 09/15/2015 tPA Given: No: Minimal is a July deficits mRankin:  Past Medical History  Diagnosis Date  . Hypertension   . Hepatitis C last year    [facility]    Past Surgical History  Procedure Laterality Date  . Tonsillectomy      Family history: Obtained from patient and was noncontributory.  Social History:  reports that he has been smoking.  He has never used smokeless tobacco. He reports that he does not drink alcohol or use illicit drugs.  Allergies: No Known Allergies  Medications: Patient's preadmission medications were reviewed by me.  ROS: History obtained from the patient  General ROS: negative for - chills, fatigue, fever, night sweats, weight gain or weight loss Psychological ROS: negative for - behavioral disorder, hallucinations, memory difficulties, mood swings or suicidal ideation Ophthalmic ROS: negative for - blurry vision, double vision, eye pain or loss of vision ENT ROS: negative for - epistaxis, nasal discharge, oral lesions, sore throat, tinnitus or vertigo Allergy and Immunology ROS: negative for - hives or itchy/watery eyes Hematological and Lymphatic ROS: negative for - bleeding problems, bruising or swollen  lymph nodes Endocrine ROS: negative for - galactorrhea, hair pattern changes, polydipsia/polyuria or temperature intolerance Respiratory ROS: negative for - cough, hemoptysis, shortness of breath or wheezing Cardiovascular ROS: negative for - chest pain, dyspnea on exertion, edema or irregular heartbeat Gastrointestinal ROS: negative for - abdominal pain, diarrhea, hematemesis, nausea/vomiting or stool incontinence Genito-Urinary ROS: negative for - dysuria, hematuria, incontinence or urinary frequency/urgency Musculoskeletal ROS: negative for - joint swelling or muscular weakness Neurological ROS: as noted in HPI Dermatological ROS: negative for rash and skin lesion changes  Physical Examination: Blood pressure 131/79, pulse 95, temperature 98.4 F (36.9 C), temperature source Oral, resp. rate 18, height 6' 3.2" (1.91 m), weight 102.6 kg (226 lb 3.1 oz), SpO2 97 %.  HEENT-  Normocephalic, no lesions, without obvious abnormality.  Normal external eye and conjunctiva.  Normal TM's bilaterally.  Normal auditory canals and external ears. Normal external nose, mucus membranes and septum.  Normal pharynx. Neck supple with no masses, nodes, nodules or enlargement. Cardiovascular - regular rate and rhythm, S1, S2 normal, no murmur, click, rub or gallop Lungs - chest clear, no wheezing, rales, normal symmetric air entry Abdomen - soft, non-tender; bowel sounds normal; no masses,  no organomegaly Extremities - no joint deformities, effusion, or inflammation and no edema  Neurologic Examination: Mental Status: Alert, oriented, thought content appropriate.  Speech fluent without evidence of aphasia. Able to follow commands without difficulty. Cranial Nerves: II-Visual fields were normal. III/IV/VI-Pupils were equal and reacted normally to light. Extraocular movements were full and conjugate.    V/VII-no facial numbness and no facial weakness. VIII-normal. X-normal speech and symmetrical palatal  movement. XI: trapezius strength/neck flexion strength normal bilaterally XII-midline tongue extension with normal strength. Motor: 5/5 bilaterally with normal tone and bulk Sensory: Slightly reduced perception of tactile sensation over left upper extremity compared to the right. Deep Tendon Reflexes: 1+ and symmetric. Plantars: Mute bilaterally Cerebellar: Normal finger-to-nose testing except for minimal intention tremor bilaterally. Carotid auscultation: Normal  Results for orders placed or performed during the hospital encounter of 09/15/15 (from the past 48 hour(s))  I-Stat Chem 8, ED  (not at [facility], [facility])     Status: Abnormal   Collection Time: 09/15/15  6:42 PM  Result Value Ref Range   Sodium 142 135 - 145 mmol/L   Potassium 3.9 3.5 - 5.1 mmol/L   Chloride 103 101 - 111 mmol/L   BUN 14 6 - 20 mg/dL   Creatinine, Ser 5.40 0.61 - 1.24 mg/dL   Glucose, Bld 981 (H) 65 - 99 mg/dL   Calcium, Ion 1.91 4.78 - 1.23 mmol/L   TCO2 24 0 - 100 mmol/L   Hemoglobin 15.6 13.0 - 17.0 g/dL   HCT 29.5 62.1 - 30.8 %  CBC     Status: Abnormal   Collection Time: 09/15/15  6:42 PM  Result Value Ref Range   WBC 9.1 4.0 - 10.5 K/uL   RBC 4.24 4.22 - 5.81 MIL/uL   Hemoglobin 13.2 13.0 - 17.0 g/dL   HCT 65.7 (L) 84.6 - 96.2 %   MCV 91.5 78.0 - 100.0 fL   MCH 31.1 26.0 - 34.0 pg   MCHC 34.0 30.0 - 36.0 g/dL   RDW 95.2 84.1 - 32.4 %   Platelets 232 150 - 400 K/uL  Differential     Status: None   Collection Time: 09/15/15  6:42 PM  Result Value Ref Range   Neutrophils Relative % 57 %   Neutro Abs 5.1 1.7 - 7.7 K/uL   Lymphocytes Relative 35 %   Lymphs Abs 3.2 0.7 - 4.0 K/uL   Monocytes Relative 7 %   Monocytes Absolute 0.7 0.1 - 1.0 K/uL   Eosinophils Relative 1 %   Eosinophils Absolute 0.1 0.0 - 0.7 K/uL   Basophils Relative 0 %   Basophils Absolute 0.0 0.0 - 0.1 K/uL   Ct Head Wo Contrast  09/15/2015  CLINICAL DATA:  Acute onset of headache, left facial numbness, blurred vision and loss  of balance while at [employer] earlier today approximately 1730 hr. Code stroke. EXAM: CT HEAD WITHOUT CONTRAST TECHNIQUE: Contiguous axial images were obtained from the base of the skull through the vertex without intravenous contrast. COMPARISON:  06/28/2009. FINDINGS: Ventricular system normal in size and appearance for age. No mass lesion. No midline shift. No acute hemorrhage or hematoma. No extra-axial fluid collections. No evidence of acute infarction. No skull fracture or other focal osseous abnormality involving the skull. Small mucous retention cysts or polyps in the right maxillary sinus. Remaining visualized paranasal sinuses, bilateral mastoid air cells and bilateral middle ear cavities well aerated. IMPRESSION: 1. Normal intracranially. 2. Mild chronic right maxillary sinus disease. I telephoned these results at the time of interpretation on 09/15/2015 at 6:51 pm to Dr. Roseanne Reno, who verbally acknowledged these results. Electronically Signed   By: Hulan Saas M.D.   On: 09/15/2015 18:52    Assessment: 59 y.o. male with multiple risk factors for stroke presenting with possible right subcortical small vessel TIA.  Stroke Risk Factors - hypertension and cigarette smoking  Plan: 1. HgbA1c, fasting lipid panel 2. MRI, MRA  of the  brain without contrast 3. PT consult, OT consult, Speech consult 4. Echocardiogram 5. Carotid dopplers 6. Prophylactic therapy-Antiplatelet med: Aspirin -  7. Risk factor modification 8. Telemetry monitoring  C.R. Roseanne Reno, MD [facility] [phone]  09/15/2015, 6:57 PM

## 2015-09-15 NOTE — ED Notes (Signed)
Pt reports 1 hour ago onset of L sided headache and L sided facial numbness. sts he lost his balance while at [employer]. C.o blurred vision as well. Possible slight L sided facial droop. Denies weakness in arms or legs. Grip strengths equal.

## 2015-09-15 NOTE — ED Notes (Signed)
Patient transported to MRI 

## 2015-09-15 NOTE — ED Provider Notes (Signed)
CSN: [id number]     Arrival date & time 09/15/15  1812 History   First MD Initiated Contact with Patient 09/15/15 1833     Chief Complaint  Patient presents with  . Numbness     (Consider location/radiation/quality/duration/timing/severity/associated sxs/prior Treatment) HPI 59 year old male with history of hypertension and tobacco use who presents with headache and left-sided facial numbness. States that for the past 6 months he has had intermittent left-sided headaches, with no prior history of headaches or migraines before this. He did not think much of this, as is typically would go away with rest or time. States that he had sudden onset of left-sided headache at 5:30 today while at the store, and this was associated with lightheadedness and tingling over the left side of his face which is new. This concerned him and he came to the ED for evaluation. Noted some mild blurry vision, but denies vertigo, slurred speech, word finding difficulty, numbness or weakness in his arms or legs. Did initially feel a little bit unstable with walking. On arrival to ED, a code stroke was activated by the initial evaluating physician. Reports having some congestion and cold-like symptoms over the weekend, but feels that this is getting better. Denies any fevers or chills, nausea or vomiting, neck pain or stiffness.   Past Medical History  Diagnosis Date  . Hypertension   . Hepatitis C last year    [facility]   Past Surgical History  Procedure Laterality Date  . Tonsillectomy     No family history on file. Social History  Substance Use Topics  . Smoking status: Current Every Day Smoker  . Smokeless tobacco: Never Used  . Alcohol Use: No    Review of Systems 10/14 systems reviewed and are negative other than those stated in the HPI    Allergies  Review of patient's allergies indicates no known allergies.  Home Medications   Prior to Admission medications    Medication Sig Start Date End Date Taking? Authorizing Provider  amLODipine (NORVASC) 5 MG tablet Take 5 mg by mouth daily.   Yes Historical Provider, MD  Multiple Vitamin (MULTIVITAMIN WITH MINERALS) TABS Take 1 tablet by mouth daily.   Yes Historical Provider, MD  ibuprofen (ADVIL,MOTRIN) 800 MG tablet Take 1 tablet (800 mg total) by mouth 3 (three) times daily. Patient not taking: Reported on 09/15/2015 04/05/15   Elson AreasLeslie K Sofia, PA-C  methocarbamol (ROBAXIN) 500 MG tablet Take 1 tablet (500 mg total) by mouth 2 (two) times daily. Patient not taking: Reported on 09/15/2015 04/05/15   Elson AreasLeslie K Sofia, PA-C  oxyCODONE-acetaminophen (PERCOCET/ROXICET) 5-325 MG per tablet Take 2 tablets by mouth every 4 (four) hours as needed for severe pain. Patient not taking: Reported on 09/15/2015 09/30/13   Irish EldersKelly Walker, NP  predniSONE (DELTASONE) 10 MG tablet 6,5,4,3,2,1,taper Patient not taking: Reported on 09/15/2015 04/05/15   Elson AreasLeslie K Sofia, PA-C   BP 108/58 mmHg  Pulse 85  Temp(Src) 98.4 F (36.9 C) (Oral)  Resp 21  Ht 6' 3.2" (1.91 m)  Wt 226 lb 3.1 oz (102.6 kg)  BMI 28.12 kg/m2  SpO2 97% Physical Exam Physical Exam  Nursing note and vitals reviewed. Constitutional: Well developed, well nourished, non-toxic, and in no acute distress Head: Normocephalic and atraumatic.  Mouth/Throat: Oropharynx is clear and moist.  Neck: Normal range of motion. Neck supple.  Cardiovascular: Normal rate and regular rhythm.   Pulmonary/Chest: Effort normal and breath sounds normal.  Abdominal: Soft. There  is no tenderness. There is no rebound and no guarding.  Musculoskeletal: Normal range of motion.  Skin: Skin is warm and dry.  Psychiatric: Cooperative Neurological:  Alert, oriented to person, place, time, and situation. Memory grossly in tact. Fluent speech. No dysarthria or aphasia.  Cranial nerves: VF are full.  Pupils are symmetric, and reactive to light. EOMI without nystagmus. No gaze deviation.  Facial muscles symmetric with activation. Sensation to light touch over face mildly diminished over left side. Hearing grossly in tact. Palate elevates symmetrically. Head turn and shoulder shrug are intact. Tongue midline.  Reflexes defered.  Muscle bulk and tone normal. No pronator drift. Moves all extremities symmetrically. Sensation to light touch is in tact throughout in bilateral upper and lower extremities, but slightly diminished in left upper extremity compared to right Coordination reveals no dysmetria with finger to nose.    ED Course  Procedures (including critical care time) Labs Review Labs Reviewed  CBC - Abnormal; Notable for the following:    HCT 38.8 (*)    All other components within normal limits  COMPREHENSIVE METABOLIC PANEL - Abnormal; Notable for the following:    Glucose, Bld 113 (*)    All other components within normal limits  I-STAT CHEM 8, ED - Abnormal; Notable for the following:    Glucose, Bld 110 (*)    All other components within normal limits  ETHANOL  PROTIME-INR  APTT  DIFFERENTIAL  URINE RAPID DRUG SCREEN, HOSP PERFORMED  URINALYSIS, ROUTINE W REFLEX MICROSCOPIC (NOT AT [facility])  HEMOGLOBIN A1C  LIPID PANEL  CBC  CREATININE, SERUM  TROPONIN I  TROPONIN I  COMPREHENSIVE METABOLIC PANEL  I-STAT TROPOININ, ED    Imaging Review Ct Head Wo Contrast  09/15/2015  CLINICAL DATA:  Acute onset of headache, left facial numbness, blurred vision and loss of balance while at [employer] earlier today approximately 1730 hr. Code stroke. EXAM: CT HEAD WITHOUT CONTRAST TECHNIQUE: Contiguous axial images were obtained from the base of the skull through the vertex without intravenous contrast. COMPARISON:  06/28/2009. FINDINGS: Ventricular system normal in size and appearance for age. No mass lesion. No midline shift. No acute hemorrhage or hematoma. No extra-axial fluid collections. No evidence of acute infarction. No skull fracture or other focal osseous  abnormality involving the skull. Small mucous retention cysts or polyps in the right maxillary sinus. Remaining visualized paranasal sinuses, bilateral mastoid air cells and bilateral middle ear cavities well aerated. IMPRESSION: 1. Normal intracranially. 2. Mild chronic right maxillary sinus disease. I telephoned these results at the time of interpretation on 09/15/2015 at 6:51 pm to Dr. Roseanne Reno, who verbally acknowledged these results. Electronically Signed   By: Hulan Saas M.D.   On: 09/15/2015 18:52   Mr Brain Wo Contrast  09/15/2015  CLINICAL DATA:  59 year old male with hypertension hepatitis-C presenting with headache followed by a numbness left side of head and face. Subsequent encounter. EXAM: MRI HEAD WITHOUT CONTRAST TECHNIQUE: Multiplanar, multiecho pulse sequences of the brain and surrounding structures were obtained without intravenous contrast. COMPARISON:  09/15/2015. FINDINGS: No acute infarct. No intracranial hemorrhage. No hydrocephalus. No intracranial mass lesion noted on this unenhanced exam. Major intracranial vascular structures are patent. Ectatic vertebral arteries and basilar artery. Mild transverse ligament hypertrophy. Cervical medullary junction, pituitary region, pineal region and orbital structures unremarkable. Polypoid opacification superior anterior right maxillary sinus. IMPRESSION: No acute infarct. Electronically Signed   By: Lacy Duverney M.D.   On: 09/15/2015 21:03   I have personally reviewed and evaluated  these images and lab results as part of my medical decision-making.   EKG Interpretation   Date/Time:  Monday September 15 2015 18:51:40 EDT Ventricular Rate:  91 PR Interval:  169 QRS Duration: 94 QT Interval:  366 QTC Calculation: 450 R Axis:   72 Text Interpretation:  Sinus rhythm Abnormal R-wave progression, early  transition No significant change since last tracing Confirmed by Felipe Paluch MD,  Annabelle Harman ([id number]) on 09/15/2015 11:05:19 PM      MDM    Final diagnoses:  Nonintractable headache, unspecified chronicity pattern, unspecified headache type  Left facial numbness    In short, this is a 59 year old male with history of hypertension and tobacco use who presents with left-sided headache associated with left-sided facial numbness. Vital signs are non-concerning on arrival, and he is nontoxic and in no acute distress. Evidence of minimal decreased sensation over the left side of his face and left upper extremity. Otherwise neurological exam is intact. NIH stroke score of 1. Has a negative head CT, and neurology has been at bedside for evaluation. Dr. Roseanne Reno recommended MRI of the brain, which is performed showing no acute intracranial processes. Symptoms are improving spontaneously No major metabolic or electrolyte derangements noted on blood work. Is admitted to the hospitalist service for TIA workup.    Lavera Guise, MD 09/15/15 [phone]

## 2015-09-16 ENCOUNTER — Observation Stay (HOSPITAL_BASED_OUTPATIENT_CLINIC_OR_DEPARTMENT_OTHER): Payer: BC Managed Care – PPO

## 2015-09-16 DIAGNOSIS — I10 Essential (primary) hypertension: Secondary | ICD-10-CM | POA: Diagnosis not present

## 2015-09-16 DIAGNOSIS — R208 Other disturbances of skin sensation: Secondary | ICD-10-CM | POA: Diagnosis not present

## 2015-09-16 DIAGNOSIS — G459 Transient cerebral ischemic attack, unspecified: Secondary | ICD-10-CM

## 2015-09-16 LAB — COMPREHENSIVE METABOLIC PANEL
ALK PHOS: 53 U/L (ref 38–126)
ALT: 22 U/L (ref 17–63)
AST: 24 U/L (ref 15–41)
Albumin: 3.7 g/dL (ref 3.5–5.0)
Anion gap: 9 (ref 5–15)
BUN: 11 mg/dL (ref 6–20)
CALCIUM: 9.6 mg/dL (ref 8.9–10.3)
CO2: 27 mmol/L (ref 22–32)
CREATININE: 1.13 mg/dL (ref 0.61–1.24)
Chloride: 104 mmol/L (ref 101–111)
Glucose, Bld: 96 mg/dL (ref 65–99)
Potassium: 3.9 mmol/L (ref 3.5–5.1)
Sodium: 140 mmol/L (ref 135–145)
Total Bilirubin: 0.8 mg/dL (ref 0.3–1.2)
Total Protein: 7 g/dL (ref 6.5–8.1)

## 2015-09-16 LAB — LIPID PANEL
CHOL/HDL RATIO: 4 ratio
CHOLESTEROL: 133 mg/dL (ref 0–200)
HDL: 33 mg/dL — ABNORMAL LOW (ref >40)
LDL CALC: 77 mg/dL (ref 0–99)
TRIGLYCERIDES: 116 mg/dL (ref <150)
VLDL: 23 mg/dL (ref 0–40)

## 2015-09-16 LAB — CREATININE, SERUM
CREATININE: 1.13 mg/dL (ref 0.61–1.24)
GFR calc Af Amer: >60 mL/min (ref >60)

## 2015-09-16 LAB — TROPONIN I
Troponin I: <0.03 ng/mL (ref <0.031)
Troponin I: <0.03 ng/mL (ref <0.031)

## 2015-09-16 LAB — GLUCOSE, CAPILLARY: Glucose-Capillary: 95 mg/dL (ref 65–99)

## 2015-09-16 MED ORDER — AMLODIPINE BESYLATE 5 MG PO TABS
5.0000 mg | ORAL_TABLET | Freq: Every day | ORAL | Status: DC
  Administered 2015-09-16: 5 mg via ORAL
  Filled 2015-09-16: qty 1

## 2015-09-16 MED ORDER — ASPIRIN EC 81 MG PO TBEC: 81.0000 mg | DELAYED_RELEASE_TABLET | Freq: Every day | ORAL | Status: DC

## 2015-09-16 MED ORDER — ACETAMINOPHEN-CODEINE #3 300-30 MG PO TABS: 1.0000 | ORAL_TABLET | Freq: Three times a day (TID) | ORAL | Status: DC | PRN

## 2015-09-16 MED ORDER — ASPIRIN 325 MG PO TABS: 325.0000 mg | ORAL_TABLET | Freq: Every day | ORAL | Status: DC

## 2015-09-16 MED ORDER — ASPIRIN 81 MG PO TABS: 81.0000 mg | ORAL_TABLET | Freq: Every day | ORAL | Status: AC

## 2015-09-16 MED ORDER — SENNOSIDES-DOCUSATE SODIUM 8.6-50 MG PO TABS: 1.0000 | ORAL_TABLET | Freq: Every evening | ORAL | Status: DC | PRN

## 2015-09-16 NOTE — Progress Notes (Signed)
Echocardiogram 2D Echocardiogram has been performed.  Nolon RodBrown, Tony 09/16/2015, 10:52 AM

## 2015-09-16 NOTE — Progress Notes (Signed)
VASCULAR LAB PRELIMINARY  PRELIMINARY  PRELIMINARY  PRELIMINARY  Carotid duplex completed.    Preliminary report:  Bilateral - No evidence of significant stenosis. Vertebral artery flow is antegrade.  Roberts Bon, RVS 09/16/2015, 11:28 AM

## 2015-09-16 NOTE — Progress Notes (Signed)
Pt refuses IV at this time. States he has already had 3 and he doesn't need another one. Refused to allow assessment of his veins. Word left for pt's primary nurse.

## 2015-09-16 NOTE — Evaluation (Signed)
Physical Therapy Evaluation and Discharge Patient Details Name: Patrick Chavez Chavez MRN: [id number] DOB: 08/14/1956 Today's Date: 09/16/2015   History of Present Illness  Pt is a 59 y/o male who presents with a HA and L-sided numbness and tingling. Pt was found to have a subtle L-sided facial droop and decreased sensation upon exam. CT/MRI negative for acute changes.   Clinical Impression  Patient evaluated by Physical Therapy with no further acute PT needs identified. All education has been completed and the patient has no further questions. At the time of PT eval pt was able to perform transfers and ambulation with modified independence. Pt and wife report that pt is at baseline of function. See below for any follow-up Physical Therapy or equipment needs. PT is signing off. Thank you for this referral.     Follow Up Recommendations No PT follow up;Supervision - Intermittent    Equipment Recommendations  None recommended by PT    Recommendations for Other Services       Precautions / Restrictions Precautions Precautions: None Restrictions Weight Bearing Restrictions: No      Mobility  Bed Mobility               General bed mobility comments: Pt received from transport wheelchair  Transfers Overall transfer level: Modified independent Equipment used: None             General transfer comment: No assist required. No unsteadiness noted.   Ambulation/Gait Ambulation/Gait assistance: Modified independent (Device/Increase time) Ambulation Distance (Feet): 50 Feet Assistive device: None Gait Pattern/deviations: WFL(Within Functional Limits) Gait velocity: Decreased Gait velocity interpretation: Below normal speed for age/gender General Gait Details: Observed pt mobilizing independently in room (decreased gait speed due to space in room). Pt was shifting weight naturally during casual conservation and able to maneuver well around obstacles in room.   Stairs             Wheelchair Mobility    Modified Rankin (Stroke Patients Only) Modified Rankin (Stroke Patients Only) Pre-Morbid Rankin Score: No symptoms Modified Rankin: No significant disability     Balance Overall balance assessment: No apparent balance deficits (not formally assessed)                                           Pertinent Vitals/Pain Pain Assessment: No/denies pain    Home Living Family/patient expects to be discharged to:: Private residence Living Arrangements: Spouse/significant other Available Help at Discharge: Family;Available 24 hours/day Type of Home: House Home Access: Level entry     Home Layout: Two level Home Equipment: Other (comment) (Walking stick)      Prior Function Level of Independence: Independent               Hand Dominance   Dominant Hand: Right    Extremity/Trunk Assessment   Upper Extremity Assessment: Overall WFL for tasks assessed           Lower Extremity Assessment: Overall WFL for tasks assessed      Cervical / Trunk Assessment: Normal  Communication   Communication: No difficulties  Cognition Arousal/Alertness: Awake/alert Behavior During Therapy: WFL for tasks assessed/performed Overall Cognitive Status: Within Functional Limits for tasks assessed                      General Comments      Exercises  Assessment/Plan    PT Assessment Patent does not need any further PT services  PT Diagnosis Difficulty walking   PT Problem List    PT Treatment Interventions     PT Goals (Current goals can be found in the Care Plan section) Acute Rehab PT Goals Patient Stated Goal: Home today PT Goal Formulation: All assessment and education complete, DC therapy    Frequency     Barriers to discharge        Co-evaluation               End of Session   Activity Tolerance: Patient tolerated treatment well Patient left: in chair;with call bell/phone within reach;with  family/visitor present Nurse Communication: Mobility status    Functional Assessment Tool Used: Clinical judgement Functional Limitation: Mobility: Walking and moving around Mobility: Walking and Moving Around Current Status ([id number](G8978): At least 1 percent but less than 20 percent impaired, limited or restricted Mobility: Walking and Moving Around Goal Status [phone]([id number]): At least 1 percent but less than 20 percent impaired, limited or restricted Mobility: Walking and Moving Around Discharge Status [phone]): At least 1 percent but less than 20 percent impaired, limited or restricted    Time: [id number] PT Time Calculation (min) (ACUTE ONLY): 19 min   Charges:   PT Evaluation $Initial PT Evaluation Tier I: 1 Procedure     PT G Codes:   PT G-Codes **NOT FOR INPATIENT CLASS** Functional Assessment Tool Used: Clinical judgement Functional Limitation: Mobility: Walking and moving around Mobility: Walking and Moving Around Current Status ([id number](G8978): At least 1 percent but less than 20 percent impaired, limited or restricted Mobility: Walking and Moving Around Goal Status [phone]): At least 1 percent but less than 20 percent impaired, limited or restricted Mobility: Walking and Moving Around Discharge Status [phone]([phone]): At least 1 percent but less than 20 percent impaired, limited or restricted    Conni SlipperKirkman, Williams Dietrick 09/16/2015, 11:26 AM   Conni SlipperLaura Chukwuebuka Churchill, PT, DPT Acute Rehabilitation Services Pager: [phone]

## 2015-09-16 NOTE — Progress Notes (Signed)
OT Cancellation Note  Patient Details Name: Junius FinnerFrederic Muns MRN: [id number] DOB: Apr 23, 1956   Cancelled Treatment:    Reason Eval/Treat Not Completed: OT screened, no needs identified, will sign off. Per discussion with PT, pt is at baseline with functional transfers and mobility and UE issues have resolved. OT signing off.   Pilar GrammesMathews, Jeancarlos Marchena H 09/16/2015, 11:35 AM

## 2015-09-16 NOTE — Progress Notes (Signed)
STROKE TEAM PROGRESS NOTE   HISTORY Patrick FinnerFrederic Chavez is an 59 y.o. male history of hypertension and hepatitis C as well as cigarette smoking presenting with onset of headache at around 5:30 PM today 09/15/2015 (LKW)she followed shortly by numbness involving the left side of his head and face. He noticed no changes in speech. Initial evaluation in the ED indicated possible side weakness involving his left upper extremity, as well as mild left facial droop. Code stroke was activated. CT scan of his head was obtained which showed no acute intracranial abnormality. NIH stroke score was 1 with residual mild left-sided numbness. Patient was not administered TPA secondary to mild residual deficits. He was admitted for further evaluation and treatment.   SUBJECTIVE (INTERVAL HISTORY) His wife and attending MD (Dr. Susie Chavez) are at the bedside.  Overall he feels his condition is completely resolved. He recounted his history with Dr. Pearlean Chavez. Admitted to having a HA yesterday and today. Admitted to getting very mad while driving yesterday. He uses Advil with some relief, tylenol does better per him. Reports his son has migraines. Denies N, V, photophobia.   OBJECTIVE Temp:  [98.1 F (36.7 C)-98.4 F (36.9 C)] 98.4 F (36.9 C) (11/01 0942) Pulse Rate:  [66-95] 69 (11/01 0942) Cardiac Rhythm:  [-] Normal sinus rhythm (11/01 0700) Resp:  [16-25] 16 (11/01 0942) BP: (104-150)/(58-94) 142/85 mmHg (11/01 0942) SpO2:  [95 %-99 %] 98 % (11/01 0942) Weight:  [93.441 kg (206 lb)-102.6 kg (226 lb 3.1 oz)] 93.441 kg (206 lb) (11/01 0006)  CBC:   Recent Labs Lab 09/15/15 1842 09/15/15 2323  WBC 9.1 8.7  NEUTROABS 5.1  --   HGB 13.2  15.6 13.0  HCT 38.8*  46.0 38.9*  MCV 91.5 92.2  PLT 232 236    Basic Metabolic Panel:   Recent Labs Lab 09/15/15 1842 09/15/15 2323 09/16/15 0440  NA 139  142  --  140  K 3.9  3.9  --  3.9  CL 103  103  --  104  CO2 27  --  27  GLUCOSE 113*  110*  --  96  BUN 11   14  --  11  CREATININE 1.14  1.20 1.13 1.13  CALCIUM 9.9  --  9.6    Lipid Panel:     Component Value Date/Time   CHOL 133 09/16/2015 0440   TRIG 116 09/16/2015 0440   HDL 33* 09/16/2015 0440   CHOLHDL 4.0 09/16/2015 0440   VLDL 23 09/16/2015 0440   LDLCALC 77 09/16/2015 0440   HgbA1c: No results found for: HGBA1C Urine Drug Screen:     Component Value Date/Time   LABOPIA NONE DETECTED 09/15/2015 2109   COCAINSCRNUR NONE DETECTED 09/15/2015 2109   LABBENZ NONE DETECTED 09/15/2015 2109   AMPHETMU NONE DETECTED 09/15/2015 2109   THCU NONE DETECTED 09/15/2015 2109   LABBARB NONE DETECTED 09/15/2015 2109      IMAGING  Ct Head Wo Contrast 09/15/2015  1. Normal intracranially. 2. Mild chronic right maxillary sinus disease.   Mr Angiogram Head Wo Contrast 09/16/2015  Negative MRA of the intracranial circulation.   Mr Brain Wo Contrast 09/15/2015   No acute infarct.    PHYSICAL EXAM Pleasant middle-aged African-American male . Afebrile. Head is nontraumatic. Neck is supple without bruit.    Cardiac exam no murmur or gallop. Lungs are clear to auscultation. Distal pulses are well felt. Neurological Exam ;  Awake  Alert oriented x 3. Normal speech and language.eye movements full without  nystagmus.fundi were not visualized. Vision acuity and fields appear normal. Hearing is normal. Palatal movements are normal. Face symmetric. Tongue midline. Normal strength, tone, reflexes and coordination. Normal sensation. Gait deferred. ASSESSMENT/PLAN Mr. Patrick Chavez is a 59 y.o. male with history of hypertension, hepatitis C, and cigarette smoking  presenting with headache with numbness involving left side of the face and equivocal left extremity weakness. He did not receive IV t-PA due to mild residual deficits.   Acute frontal HA. No Stroke  MRI  No acute stroke  MRA  Unremarkable   Carotid Doppler  pending   2D Echo  pending   LDL 77  HgbA1c pending  Lovenox 40 mg sq  daily for VTE prophylaxis Diet regular Room service appropriate?: Yes; Fluid consistency:: Thin Diet - low sodium heart healthy  No antithrombotic prior to admission, now on aspirin 325 mg daily. Dr. Pearlean Brownie recommends low dose aspirin at discharge  Can follow up with neurology if HA continues to be worrisome  Ok to treat with tylenol #3 for pain  Therapy recommendations:  No therapy needs  Disposition:  Return home  Hypertension  Stable  Hyperlipidemia  Home meds:  No statin  LDL 77  Other Stroke Risk Factors  Cigarette smoker, advised to stop smoking  Other Active Problems  Hepatitis C  Hospital day # 1  Patrick Chavez [facility] See Amion for Pager information 09/16/2015 11:17 AM   I have personally examined this patient, reviewed notes, independently viewed imaging studies, participated in medical decision making and plan of care. I have made any additions or clarifications directly to the above note. Agree with note above. He presented with headache with transient facial paresthesias of unclear etiology. He remains at risk for neurological worsening, recurrence TIA, stroke and needs ongoing evaluation. I agree with starting aspirin for stroke prevention. Treat his headaches with Tylenol with codeine. if headaches remain persistent may consider adding Topamax  Recommend outpatient follow-up  Patrick Heady, MD Medical Director [facility] Pager: [phone] 09/16/2015 4:05 PM   To contact Stroke Continuity provider, please refer to [email or web address]. After hours, contact General Neurology

## 2015-09-16 NOTE — Progress Notes (Deleted)
VASCULAR LAB PRELIMINARY  PRELIMINARY  PRELIMINARY  PRELIMINARY  Bilateral lower extremity venous duplex completed.    Preliminary report:  Bilateral:  No evidence of DVT, superficial thrombosis, or Baker's Cyst.   Gunnison Chahal, RVS 09/16/2015, 11:16 AM

## 2015-09-16 NOTE — Care Management Note (Signed)
Case Management Note  Patient Details  Name: Junius FinnerFrederic Sinko MRN: [id number] Date of Birth: 1956-03-24  Subjective/Objective:                    Action/Plan: Patient admitted with left facial numbness. Patient lives at home with his spouse. Patient being discharged home today. Awaiting recommendations from PT/OT. No PCP on file but Pt states he sees Dr Ernestine Conradichard Pavlock. CM will continue to follow for discharge needs.   Expected Discharge Date:                  Expected Discharge Plan:  Home/Self Care  In-House Referral:     Discharge planning Services     Post Acute Care Choice:    Choice offered to:     DME Arranged:    DME Agency:     HH Arranged:    HH Agency:     Status of Service:  In process, will continue to follow  Medicare Important Message Given:    Date Medicare IM Given:    Medicare IM give by:    Date Additional Medicare IM Given:    Additional Medicare Important Message give by:     If discussed at Long Length of Stay Meetings, dates discussed:    Additional Comments:  Kermit BaloKelli F Tannisha Kennington, RN 09/16/2015, 11:35 AM

## 2015-09-16 NOTE — Progress Notes (Signed)
Pt transported to unit from ED via RN x 1. Pt alert and oriented upon arrival. No complaints of pain or discomfort. No signs or symptoms of acute distress. Pt connected to telemetry and central monitoring notified. Pt oriented to unit as well as unit procedures. Pt now resting in bed at lowest position, bed alarm on, call light in reach. Will continue to monitor. Delfino Lovettichie Eural Holzschuh, RN, BSN 09/16/2015 12:40 AM

## 2015-09-16 NOTE — Progress Notes (Deleted)
VASCULAR LAB PRELIMINARY  PRELIMINARY  PRELIMINARY  PRELIMINARY  Bilateral lower extremity venous duplex completed.    Preliminary report:  Bilateral:  No evidence of DVT, superficial thrombosis, or Baker's Cyst.   Patrick Chavez, RVS 09/16/2015, 11:13 AM

## 2015-09-16 NOTE — Progress Notes (Signed)
Pt for discharge home today. Discharge orders received. Discharge instructions and prescriptions given with verbalized understanding. Stressed the importance of smoking cessation to prevent stroke. Family at bedside to assist with discharge. Staff brought patient to lobby via wheelchair at 1245. Transported to home by family member.

## 2015-09-16 NOTE — Discharge Summary (Signed)
Physician Discharge Summary  Patrick Chavez MRN: [id number] DOB/AGE: [birth year] 59 y.o.  PCP: Default, Provider, MD   Admit date: 09/15/2015 Discharge date: 09/16/2015  Discharge Diagnoses:     Active Problems:   Left facial numbness   Brain TIA   TIA (transient ischemic attack)    Follow-up recommendations Follow-up with PCP in 3-5 days , including all  additional recommended appointments as below Follow-up CBC, CMP in 3-5 days      Medication List    STOP taking these medications        ibuprofen 800 MG tablet  Commonly known as:  ADVIL,MOTRIN     methocarbamol 500 MG tablet  Commonly known as:  ROBAXIN     oxyCODONE-acetaminophen 5-325 MG tablet  Commonly known as:  PERCOCET/ROXICET     predniSONE 10 MG tablet  Commonly known as:  DELTASONE      TAKE these medications        amLODipine 5 MG tablet  Commonly known as:  NORVASC  Take 5 mg by mouth daily.     aspirin 325 MG tablet  Take 1 tablet (325 mg total) by mouth daily.     multivitamin with minerals Tabs tablet  Take 1 tablet by mouth daily.     senna-docusate 8.6-50 MG tablet  Commonly known as:  Senokot-S  Take 1 tablet by mouth at bedtime as needed for moderate constipation.         Discharge Condition: * Stable  Discharge Instructions       Discharge Instructions    Diet - low sodium heart healthy    Complete by:  As directed      Increase activity slowly    Complete by:  As directed            No Known Allergies    Disposition: 01-Home or Self Care   Consults:  neurolgy   Significant Diagnostic Studies:  Ct Head Wo Contrast  09/15/2015  CLINICAL DATA:  Acute onset of headache, left facial numbness, blurred vision and loss of balance while at [employer] earlier today approximately 1730 hr. Code stroke. EXAM: CT HEAD WITHOUT CONTRAST TECHNIQUE: Contiguous axial images were obtained from the base of the skull through the vertex without intravenous contrast.  COMPARISON:  06/28/2009. FINDINGS: Ventricular system normal in size and appearance for age. No mass lesion. No midline shift. No acute hemorrhage or hematoma. No extra-axial fluid collections. No evidence of acute infarction. No skull fracture or other focal osseous abnormality involving the skull. Small mucous retention cysts or polyps in the right maxillary sinus. Remaining visualized paranasal sinuses, bilateral mastoid air cells and bilateral middle ear cavities well aerated. IMPRESSION: 1. Normal intracranially. 2. Mild chronic right maxillary sinus disease. I telephoned these results at the time of interpretation on 09/15/2015 at 6:51 pm to Dr. Nicole Kindred, who verbally acknowledged these results. Electronically Signed   By: Evangeline Dakin M.D.   On: 09/15/2015 18:52   Mr Angiogram Head Wo Contrast  09/16/2015  CLINICAL DATA:  Initial evaluation for acute left-sided headache. EXAM: MRA HEAD WITHOUT CONTRAST TECHNIQUE: Angiographic images of the Circle of Willis were obtained using MRA technique without intravenous contrast. COMPARISON:  Prior MRI from earlier the same day. FINDINGS: ANTERIOR CIRCULATION: Visualized distal cervical segments of the internal carotid arteries are patent with antegrade flow. The petrous, cavernous, and supra clinoid segments are well opacified bilaterally. Mild ectasia of the cavernous ICAs without focal aneurysm. A1 segments are widely patent. Anterior communicating artery and anterior  cerebral arteries widely patent. M1 segments well opacified without stenosis or occlusion. MCA bifurcations normal. Distal MCA branches patent bilaterally. POSTERIOR CIRCULATION: Left vertebral artery slightly dominant. Vertebral arteries are patent to the vertebrobasilar junction. Posterior inferior cerebral arteries patent bilaterally. Basilar artery mildly tortuous but widely patent. Superior cerebellar arteries patent bilaterally. Fetal origin of the right PCA with widely patent right  posterior communicating artery. Left P1 segment widely patent. Posterior cerebral arteries are well opacified to their distal aspects. No aneurysm or vascular malformation. IMPRESSION: Negative MRA of the intracranial circulation. Electronically Signed   By: Jeannine Boga M.D.   On: 09/16/2015 00:30   Mr Brain Wo Contrast  09/15/2015  CLINICAL DATA:  59 year old male with hypertension hepatitis-C presenting with headache followed by a numbness left side of head and face. Subsequent encounter. EXAM: MRI HEAD WITHOUT CONTRAST TECHNIQUE: Multiplanar, multiecho pulse sequences of the brain and surrounding structures were obtained without intravenous contrast. COMPARISON:  09/15/2015. FINDINGS: No acute infarct. No intracranial hemorrhage. No hydrocephalus. No intracranial mass lesion noted on this unenhanced exam. Major intracranial vascular structures are patent. Ectatic vertebral arteries and basilar artery. Mild transverse ligament hypertrophy. Cervical medullary junction, pituitary region, pineal region and orbital structures unremarkable. Polypoid opacification superior anterior right maxillary sinus. IMPRESSION: No acute infarct. Electronically Signed   By: Genia Del M.D.   On: 09/15/2015 21:03    echocardiogram  results pending    Filed Weights   09/15/15 1830 09/16/15 0006  Weight: 102.6 kg (226 lb 3.1 oz) 93.441 kg (206 lb)     Microbiology: No results found for this or any previous visit (from the past 240 hour(s)).     Blood Culture No results found for: SDES, [place], [place], REPTSTATUS    Labs: Results for orders placed or performed during the hospital encounter of 09/15/15 (from the past 48 hour(s))  I-stat troponin, ED (not at [facility], [facility])     Status: None   Collection Time: 09/15/15  6:41 PM  Result Value Ref Range   Troponin i, poc 0.00 0.00 - 0.08 ng/mL   Comment 3            Comment: Due to the release kinetics of cTnI, a negative result within the first  hours of the onset of symptoms does not rule out myocardial infarction with certainty. If myocardial infarction is still suspected, repeat the test at appropriate intervals.   I-Stat Chem 8, ED  (not at [facility], [facility])     Status: Abnormal   Collection Time: 09/15/15  6:42 PM  Result Value Ref Range   Sodium 142 135 - 145 mmol/L   Potassium 3.9 3.5 - 5.1 mmol/L   Chloride 103 101 - 111 mmol/L   BUN 14 6 - 20 mg/dL   Creatinine, Ser 1.20 0.61 - 1.24 mg/dL   Glucose, Bld 110 (H) 65 - 99 mg/dL   Calcium, Ion 1.23 1.12 - 1.23 mmol/L   TCO2 24 0 - 100 mmol/L   Hemoglobin 15.6 13.0 - 17.0 g/dL   HCT 46.0 39.0 - 52.0 %  Ethanol     Status: None   Collection Time: 09/15/15  6:42 PM  Result Value Ref Range   Alcohol, Ethyl (B) <5 <5 mg/dL    Comment:        LOWEST DETECTABLE LIMIT FOR SERUM ALCOHOL IS 5 mg/dL FOR MEDICAL PURPOSES ONLY   Protime-INR     Status: None   Collection Time: 09/15/15  6:42 PM  Result Value Ref Range  Prothrombin Time 14.5 11.6 - 15.2 seconds   INR 1.11 0.00 - 1.49  APTT     Status: None   Collection Time: 09/15/15  6:42 PM  Result Value Ref Range   aPTT 30 24 - 37 seconds  CBC     Status: Abnormal   Collection Time: 09/15/15  6:42 PM  Result Value Ref Range   WBC 9.1 4.0 - 10.5 K/uL   RBC 4.24 4.22 - 5.81 MIL/uL   Hemoglobin 13.2 13.0 - 17.0 g/dL   HCT 38.8 (L) 39.0 - 52.0 %   MCV 91.5 78.0 - 100.0 fL   MCH 31.1 26.0 - 34.0 pg   MCHC 34.0 30.0 - 36.0 g/dL   RDW 13.3 11.5 - 15.5 %   Platelets 232 150 - 400 K/uL  Differential     Status: None   Collection Time: 09/15/15  6:42 PM  Result Value Ref Range   Neutrophils Relative % 57 %   Neutro Abs 5.1 1.7 - 7.7 K/uL   Lymphocytes Relative 35 %   Lymphs Abs 3.2 0.7 - 4.0 K/uL   Monocytes Relative 7 %   Monocytes Absolute 0.7 0.1 - 1.0 K/uL   Eosinophils Relative 1 %   Eosinophils Absolute 0.1 0.0 - 0.7 K/uL   Basophils Relative 0 %   Basophils Absolute 0.0 0.0 - 0.1 K/uL  Comprehensive metabolic  panel     Status: Abnormal   Collection Time: 09/15/15  6:42 PM  Result Value Ref Range   Sodium 139 135 - 145 mmol/L   Potassium 3.9 3.5 - 5.1 mmol/L   Chloride 103 101 - 111 mmol/L   CO2 27 22 - 32 mmol/L   Glucose, Bld 113 (H) 65 - 99 mg/dL   BUN 11 6 - 20 mg/dL   Creatinine, Ser 1.14 0.61 - 1.24 mg/dL   Calcium 9.9 8.9 - 10.3 mg/dL   Total Protein 7.7 6.5 - 8.1 g/dL   Albumin 4.0 3.5 - 5.0 g/dL   AST 27 15 - 41 U/L   ALT 24 17 - 63 U/L   Alkaline Phosphatase 58 38 - 126 U/L   Total Bilirubin 0.7 0.3 - 1.2 mg/dL   GFR calc non Af Amer >60 >60 mL/min   GFR calc Af Amer >60 >60 mL/min    Comment: (NOTE) The eGFR has been calculated using the CKD EPI equation. This calculation has not been validated in all clinical situations. eGFR's persistently <60 mL/min signify possible Chronic Kidney Disease.    Anion gap 9 5 - 15  Glucose, capillary     Status: None   Collection Time: 09/15/15  6:53 PM  Result Value Ref Range   Glucose-Capillary 95 65 - 99 mg/dL  Urine rapid drug screen (hosp performed)not at [facility]     Status: None   Collection Time: 09/15/15  9:09 PM  Result Value Ref Range   Opiates NONE DETECTED NONE DETECTED   Cocaine NONE DETECTED NONE DETECTED   Benzodiazepines NONE DETECTED NONE DETECTED   Amphetamines NONE DETECTED NONE DETECTED   Tetrahydrocannabinol NONE DETECTED NONE DETECTED   Barbiturates NONE DETECTED NONE DETECTED    Comment:        DRUG SCREEN FOR MEDICAL PURPOSES ONLY.  IF CONFIRMATION IS NEEDED FOR ANY PURPOSE, NOTIFY LAB WITHIN 5 DAYS.        LOWEST DETECTABLE LIMITS FOR URINE DRUG SCREEN Drug Class       Cutoff (ng/mL) Amphetamine      1000 Barbiturate  200 Benzodiazepine   250 Tricyclics       037 Opiates          300 Cocaine          300 THC              50   Urinalysis, Routine w reflex microscopic (not at [facility])     Status: None   Collection Time: 09/15/15  9:09 PM  Result Value Ref Range   Color, Urine YELLOW YELLOW    APPearance CLEAR CLEAR   Specific Gravity, Urine 1.019 1.005 - 1.030   pH 7.0 5.0 - 8.0   Glucose, UA NEGATIVE NEGATIVE mg/dL   Hgb urine dipstick NEGATIVE NEGATIVE   Bilirubin Urine NEGATIVE NEGATIVE   Ketones, ur NEGATIVE NEGATIVE mg/dL   Protein, ur NEGATIVE NEGATIVE mg/dL   Urobilinogen, UA 1.0 0.0 - 1.0 mg/dL   Nitrite NEGATIVE NEGATIVE   Leukocytes, UA NEGATIVE NEGATIVE    Comment: MICROSCOPIC NOT DONE ON URINES WITH NEGATIVE PROTEIN, BLOOD, LEUKOCYTES, NITRITE, OR GLUCOSE <1000 mg/dL.  CBC     Status: Abnormal   Collection Time: 09/15/15 11:23 PM  Result Value Ref Range   WBC 8.7 4.0 - 10.5 K/uL   RBC 4.22 4.22 - 5.81 MIL/uL   Hemoglobin 13.0 13.0 - 17.0 g/dL   HCT 38.9 (L) 39.0 - 52.0 %   MCV 92.2 78.0 - 100.0 fL   MCH 30.8 26.0 - 34.0 pg   MCHC 33.4 30.0 - 36.0 g/dL   RDW 13.5 11.5 - 15.5 %   Platelets 236 150 - 400 K/uL  Creatinine, serum     Status: None   Collection Time: 09/15/15 11:23 PM  Result Value Ref Range   Creatinine, Ser 1.13 0.61 - 1.24 mg/dL   GFR calc non Af Amer >60 >60 mL/min   GFR calc Af Amer >60 >60 mL/min    Comment: (NOTE) The eGFR has been calculated using the CKD EPI equation. This calculation has not been validated in all clinical situations. eGFR's persistently <60 mL/min signify possible Chronic Kidney Disease.   Troponin I     Status: None   Collection Time: 09/15/15 11:23 PM  Result Value Ref Range   Troponin I <0.03 <0.031 ng/mL    Comment:        NO INDICATION OF MYOCARDIAL INJURY.   Troponin I     Status: None   Collection Time: 09/16/15  4:40 AM  Result Value Ref Range   Troponin I <0.03 <0.031 ng/mL    Comment:        NO INDICATION OF MYOCARDIAL INJURY.   Comprehensive metabolic panel     Status: None   Collection Time: 09/16/15  4:40 AM  Result Value Ref Range   Sodium 140 135 - 145 mmol/L   Potassium 3.9 3.5 - 5.1 mmol/L   Chloride 104 101 - 111 mmol/L   CO2 27 22 - 32 mmol/L   Glucose, Bld 96 65 - 99 mg/dL    BUN 11 6 - 20 mg/dL   Creatinine, Ser 1.13 0.61 - 1.24 mg/dL   Calcium 9.6 8.9 - 10.3 mg/dL   Total Protein 7.0 6.5 - 8.1 g/dL   Albumin 3.7 3.5 - 5.0 g/dL   AST 24 15 - 41 U/L   ALT 22 17 - 63 U/L   Alkaline Phosphatase 53 38 - 126 U/L   Total Bilirubin 0.8 0.3 - 1.2 mg/dL   GFR calc non Af Amer >60 >60 mL/min  GFR calc Af Amer >60 >60 mL/min    Comment: (NOTE) The eGFR has been calculated using the CKD EPI equation. This calculation has not been validated in all clinical situations. eGFR's persistently <60 mL/min signify possible Chronic Kidney Disease.    Anion gap 9 5 - 15  Lipid panel     Status: Abnormal   Collection Time: 09/16/15  4:40 AM  Result Value Ref Range   Cholesterol 133 0 - 200 mg/dL   Triglycerides 116 <150 mg/dL   HDL 33 (L) >40 mg/dL   Total CHOL/HDL Ratio 4.0 RATIO   VLDL 23 0 - 40 mg/dL   LDL Cholesterol 77 0 - 99 mg/dL    Comment:        Total Cholesterol/HDL:CHD Risk Coronary Heart Disease Risk Table                     Men   Women  1/2 Average Risk   3.4   3.3  Average Risk       5.0   4.4  2 X Average Risk   9.6   7.1  3 X Average Risk  23.4   11.0        Use the calculated Patient Ratio above and the CHD Risk Table to determine the patient's CHD Risk.        ATP III CLASSIFICATION (LDL):  <100     mg/dL   Optimal  100-129  mg/dL   Near or Above                    Optimal  130-159  mg/dL   Borderline  160-189  mg/dL   High  >190     mg/dL   Very High      Lipid Panel     Component Value Date/Time   CHOL 133 09/16/2015 0440   TRIG 116 09/16/2015 0440   HDL 33* 09/16/2015 0440   CHOLHDL 4.0 09/16/2015 0440   VLDL 23 09/16/2015 0440   LDLCALC 77 09/16/2015 0440     No results found for: HGBA1C   Lab Results  Component Value Date   LDLCALC 77 09/16/2015   CREATININE 1.13 09/16/2015     HPI :*59 year old male with a history of hypertension, nicotine dependence, hepatitis C, presents with headache and left-sided facial  numbness and left upper extremity weakness, onset at 5:30 PM today. Patient also described a throbbing left frontal headache associated with the symptoms. His symptoms were also associated with imbalance. Denied any blurry vision or slurred speech. Found to have subtle left-sided facial droop upon exam but decreased sensation of the left side of the face.Also 4/5 strength in left hand grasp compared to 5/5 strength R hand grasp. Bilateral lower extremity strength normal. Nl gait. Given possible facial droop, L hand weakness, code stroke activated. Patient seen by neurology. To scan of the head and MRI of the brain were negative for acute CVA. Neurology recommended admission for a full stroke workup.   HOSPITAL COURSE:   Probable right subcortical small vessel TIA vs complex migraine .-Seen by neurology, recommend full TIA workup including MRI/MRA of the brain which was negative, echocardiogram done, results pending at this time, carotid Doppler pending at this time, PT/OT evaluation pending, patient is ambulatory, telemetry monitoring uneventful, started on aspirin 81 mg for stroke prophylaxis.to be continued  LDL 77, hemoglobin A1c pending Patient anticipated to be discharged today if cleared by neurology Patient anxious to be discharged as  soon as possible.  Headache : Dr Leonie Man recommends tylenol with codeine for pts headache and recommends neurology followup if HA do not improve, pts gets atleast 2 HA's a week   Nicotine dependence-nicotine cessation counseling done-will provide a nicotine patch   Hypertension-continue Norvasc, blood pressure slightly elevated into the 150s, avoid aggressive correction.     Discharge Exam:   Blood pressure 142/85, pulse 69, temperature 98.4 F (36.9 C), temperature source Oral, resp. rate 16, height 6' 3"  (1.905 m), weight 93.441 kg (206 lb), SpO2 98 %.  Cardiovascular: RRR, S1 normal, S2 normal, no MRG, pulses symmetric and intact bilaterally   Pulmonary/Chest: CTAB, no wheezes, rales, or rhonchi  Abdominal: Soft. Non-tender, non-distended, bowel sounds are normal, no masses, organomegaly, or guarding present.  GU: no CVA tenderness Musculoskeletal: No joint deformities, erythema, or stiffness, ROM full and no nontender Ext: no edema and no cyanosis, pulses palpable bilaterally (DP and PT)  Hematology: no cervical, inginal, or axillary adenopathy.  Neurological: A&O x3, Motor: 5/5 bilaterally with normal tone and bulk Sensory: Slightly reduced perception of tactile sensation over left upper extremity compared to the right.Skin: Warm, dry and intact. No rash, cyanosis, or clubbing.       SignedReyne Dumas 09/16/2015, 10:59 AM        Time spent >45 mins

## 2015-09-17 LAB — HEMOGLOBIN A1C
HEMOGLOBIN A1C: 6 % — AB (ref 4.8–5.6)
MEAN PLASMA GLUCOSE: 126 mg/dL

## 2016-11-09 IMAGING — CT CT HEAD W/O CM
2 series · 15 of 30 positions shown, 19 images · non-contrast
Comparison: 06/28/2009.

CLINICAL DATA: Acute onset of headache, left facial numbness,
blurred vision and loss of balance while at [employer] earlier today
approximately 0482 hr. Code stroke.

EXAM:
CT HEAD WITHOUT CONTRAST
TECHNIQUE: Contiguous axial images were obtained from the base of the skull
through the vertex without intravenous contrast.

[Series 201: head w/o, idose (1) · axial · non-contrast · 0.41mm/px · z∈[+77,+202]mm · 13 of 31 slices shown, 17 images]
[im 3/31  brain]
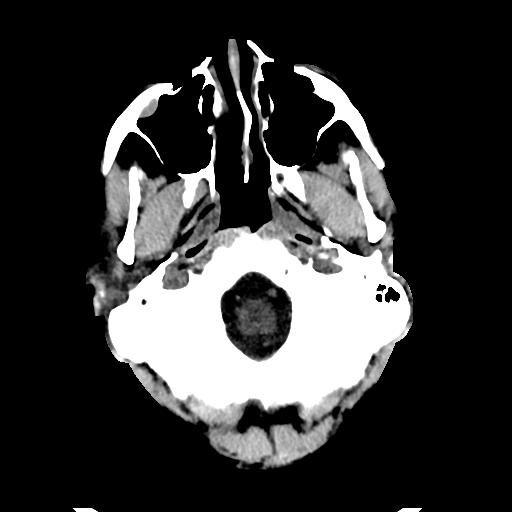
[im 3/31  bone]
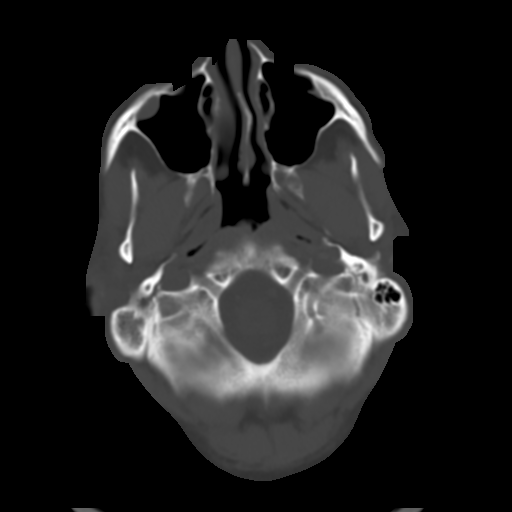
[im 5/31  brain]
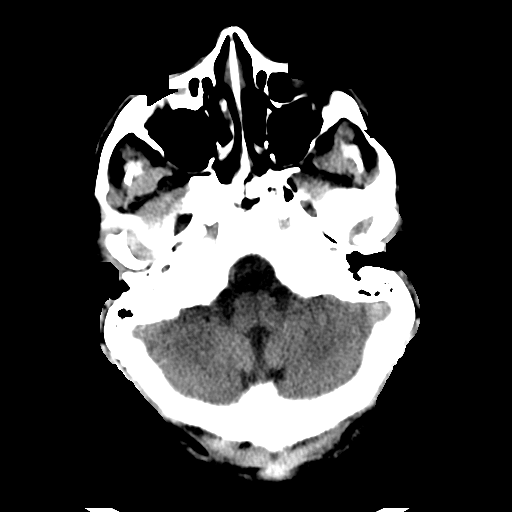
[im 7/31  brain]
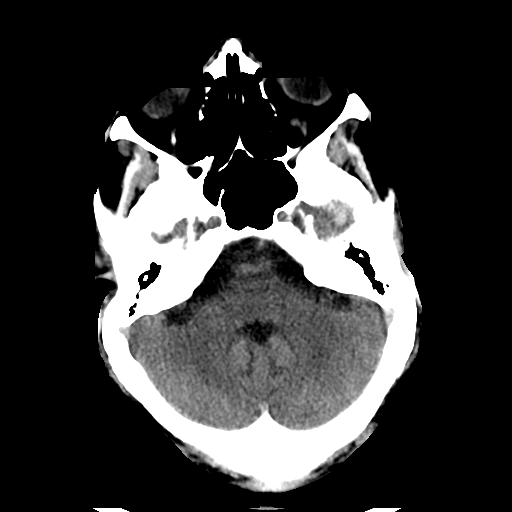
[im 9/31  brain]
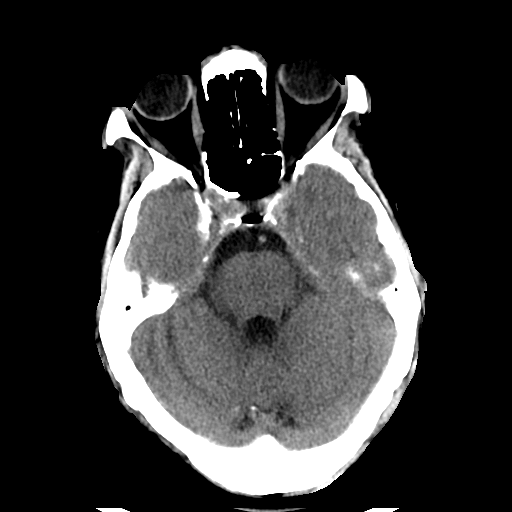
[im 11/31  brain]
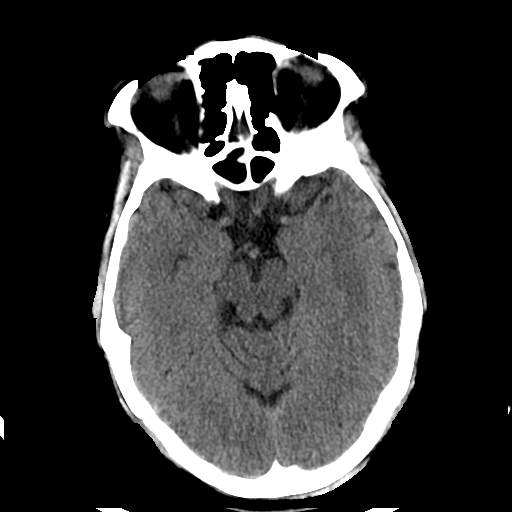
[im 11/31  bone]
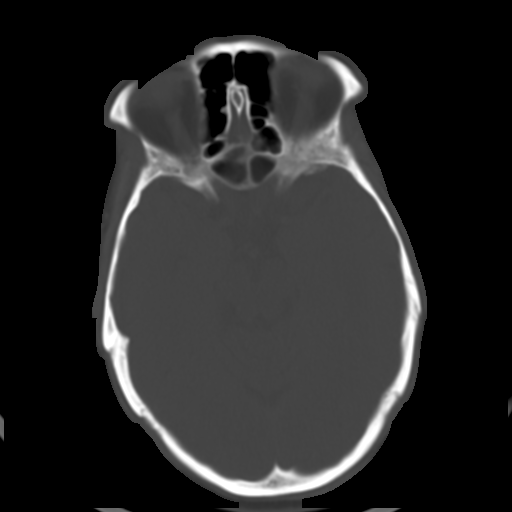
[im 13/31  brain]
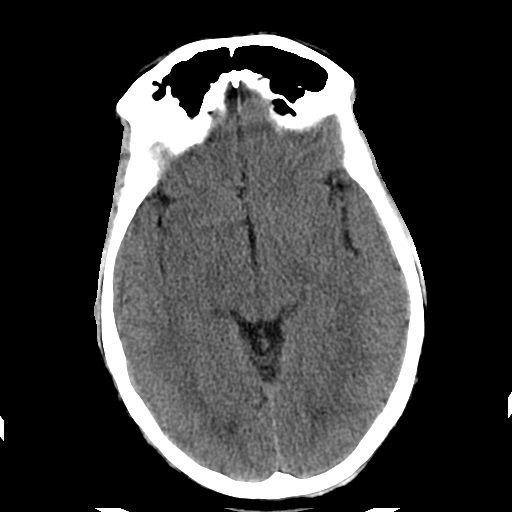
[im 16/31  brain]
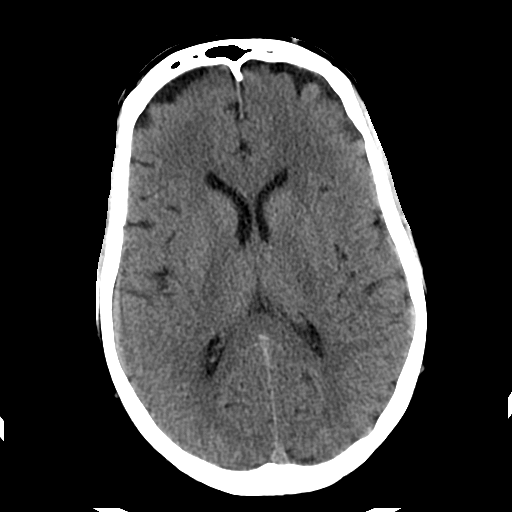
[im 18/31  brain]
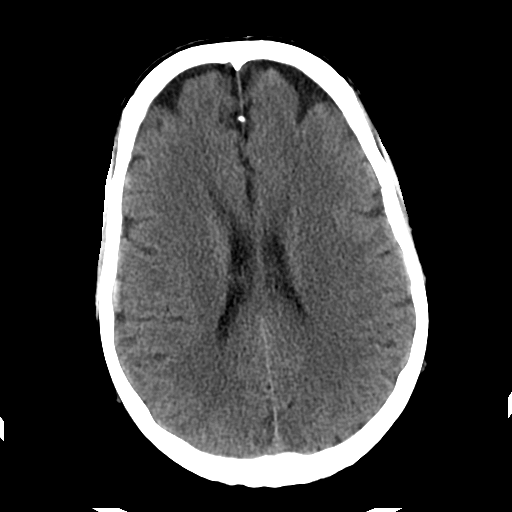
[im 20/31  brain]
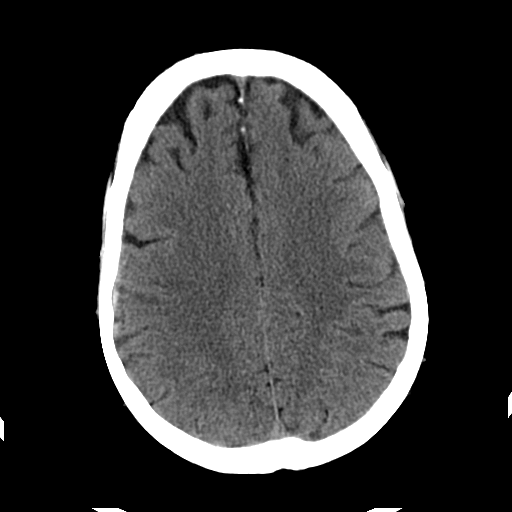
[im 20/31  bone]
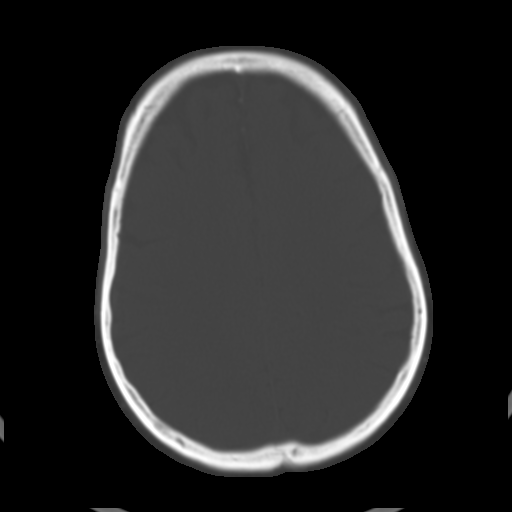
[im 22/31  brain]
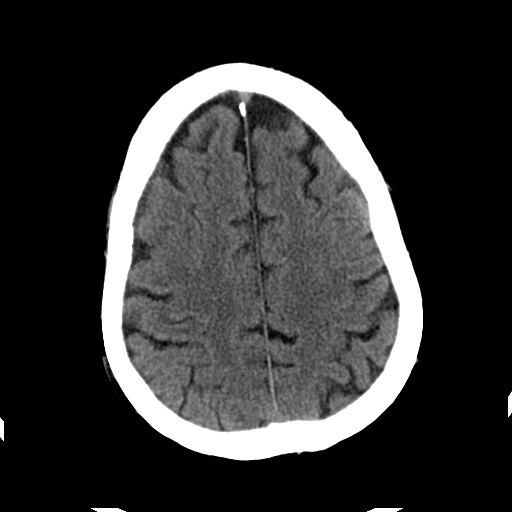
[im 24/31  brain]
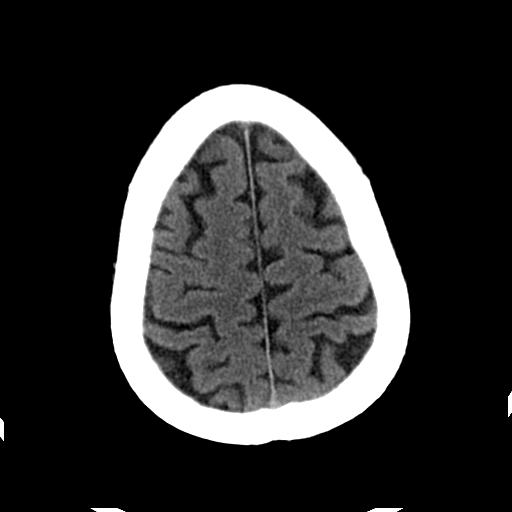
[im 26/31  brain]
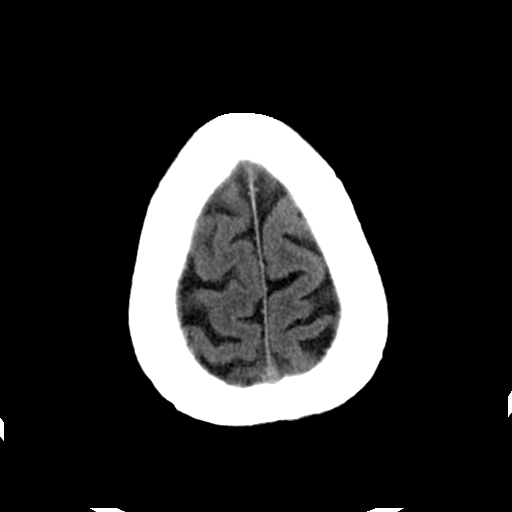
[im 28/31  brain]
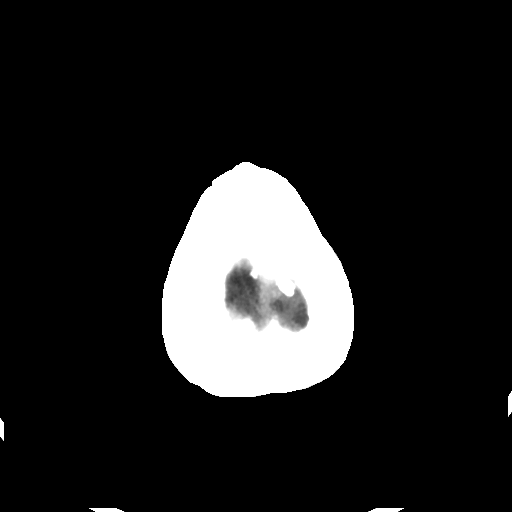
[im 28/31  bone]
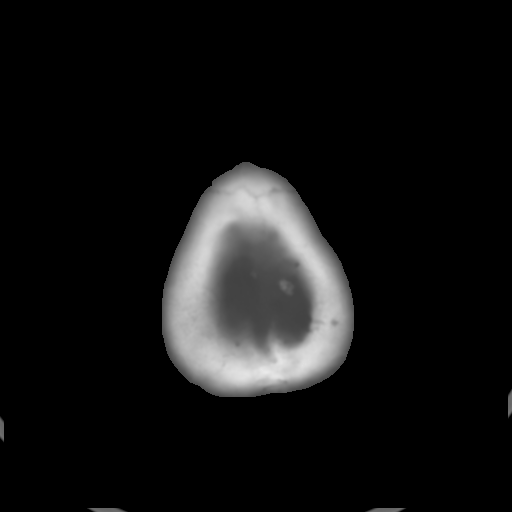

[Series 202: head w/o bone, idose (1) · axial · non-contrast · 0.41mm/px · z∈[+77,+97]mm · 2 of 31 slices shown]
[im 3/31  bone]
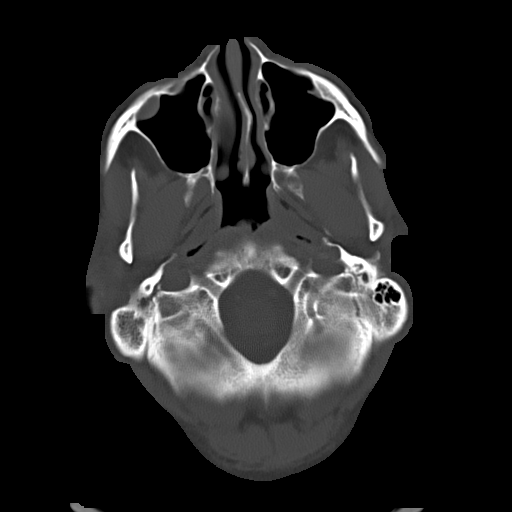
[im 7/31  bone]
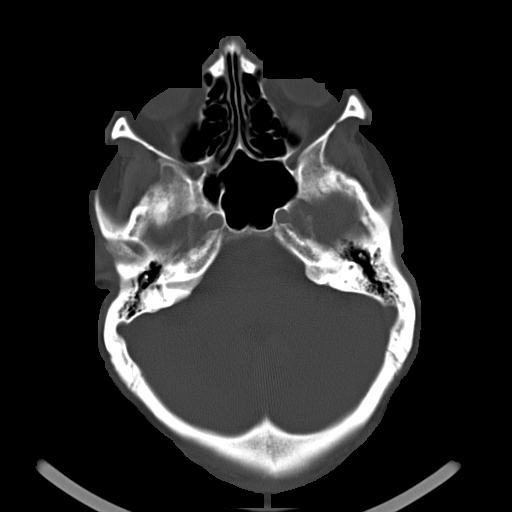

[15 of 30 positions shown; findings below may reference images not displayed]

FINDINGS: Ventricular system normal in size and appearance for age. No mass
lesion. No midline shift. No acute hemorrhage or hematoma. No
extra-axial fluid collections. No evidence of acute infarction.

No skull fracture or other focal osseous abnormality involving the
skull. Small mucous retention cysts or polyps in the right maxillary
sinus. Remaining visualized paranasal sinuses, bilateral mastoid air
cells and bilateral middle ear cavities well aerated.
IMPRESSION: 1. Normal intracranially.
2. Mild chronic right maxillary sinus disease.
I telephoned these results at the time of interpretation on
09/15/2015 at [DATE] to Dr. Lira, who verbally acknowledged
these results.

## 2016-11-09 IMAGING — MR MR MRA HEAD W/O CM
1 series · 19 of 48 positions shown · non-contrast
Comparison: Prior MRI from earlier the same day.

CLINICAL DATA: Initial evaluation for acute left-sided headache.

EXAM:
MRA HEAD WITHOUT CONTRAST
TECHNIQUE: Angiographic images of the Circle of Willis were obtained using MRA
technique without intravenous contrast.

[Series 3: (id) mt fs · axial · 1.4mm · 0.43mm/px · z∈[-102,-0]mm · 19 of 154 slices shown]
[im 1/154]
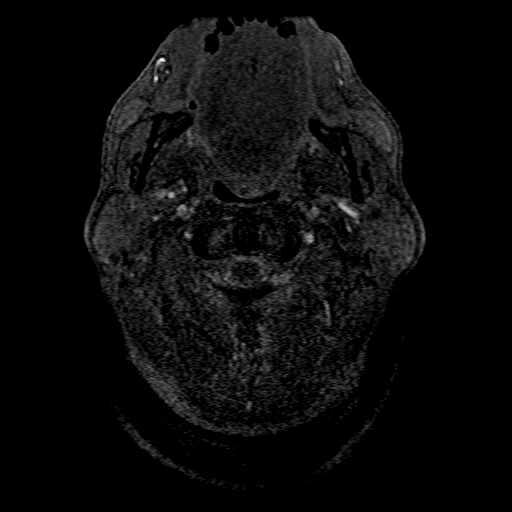
[im 4/154]
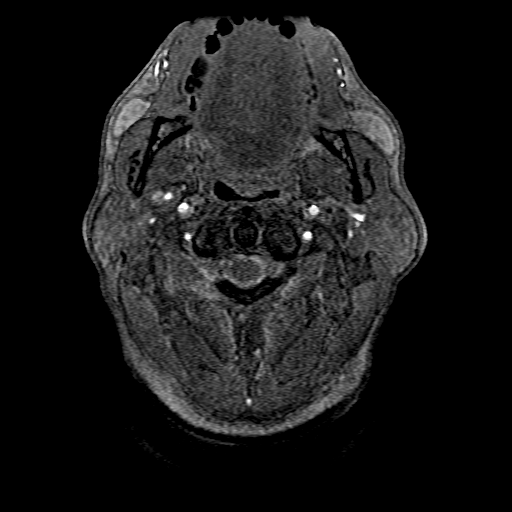
[im 7/154]
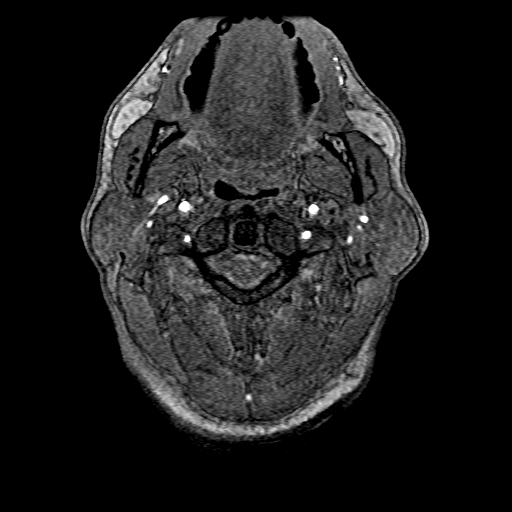
[im 10/154]
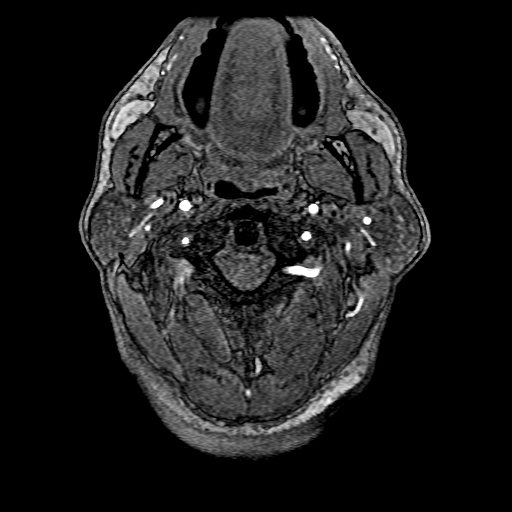
[im 14/154]
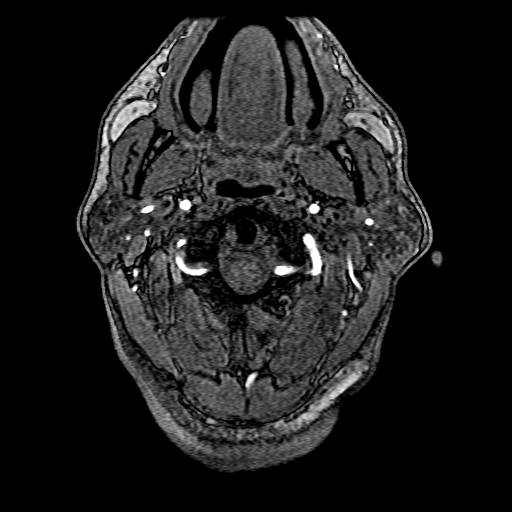
[im 17/154]
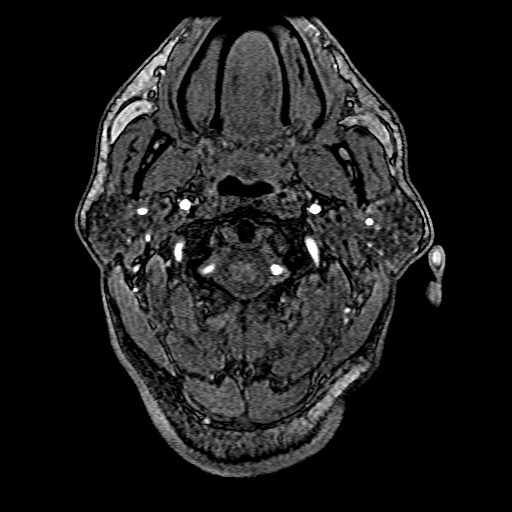
[im 20/154]
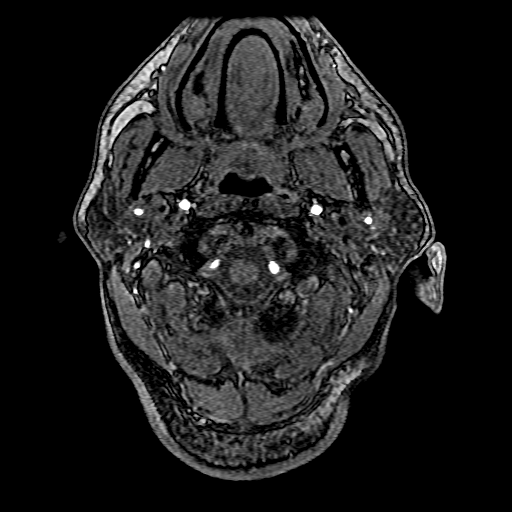
[im 23/154]
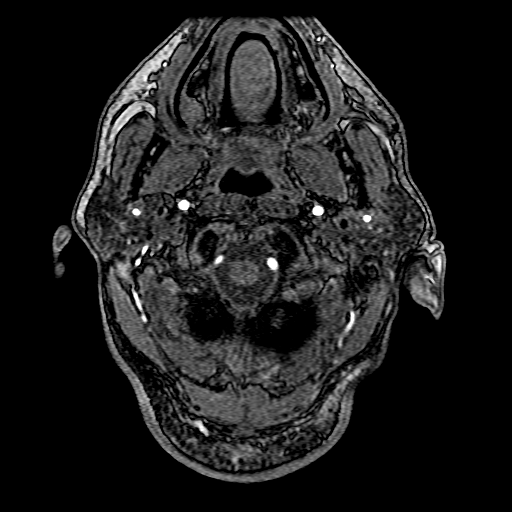
[im 27/154]
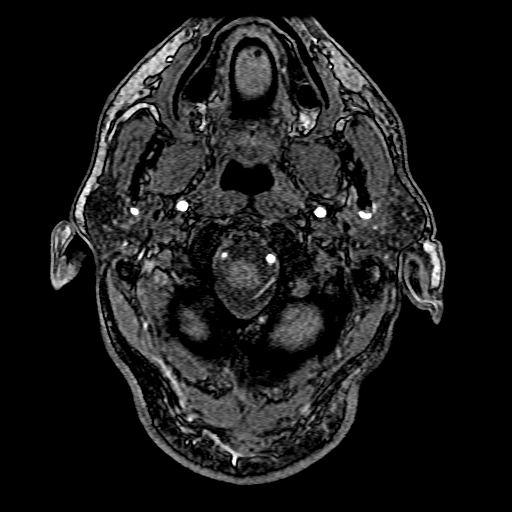
[im 30/154]
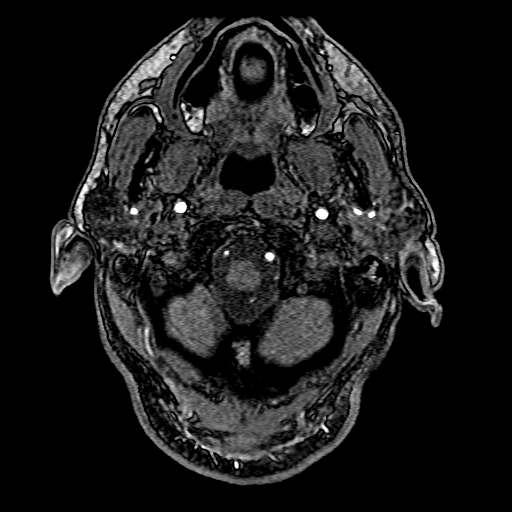
[im 33/154]
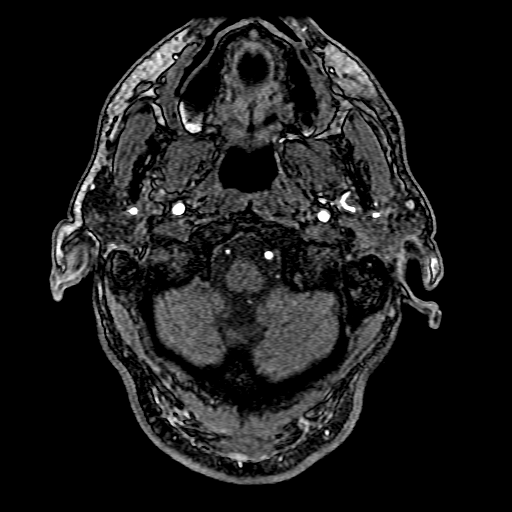
[im 49/154]
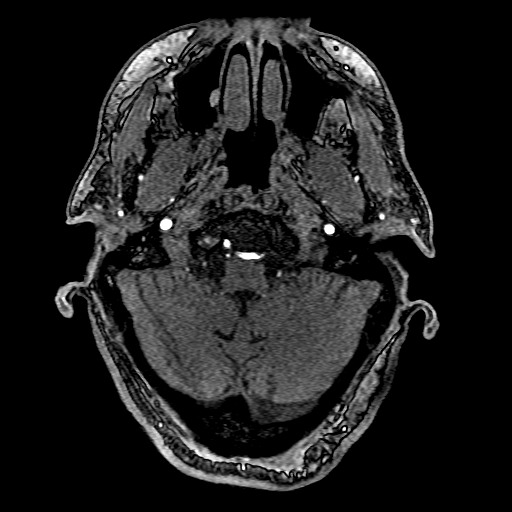
[im 69/154]
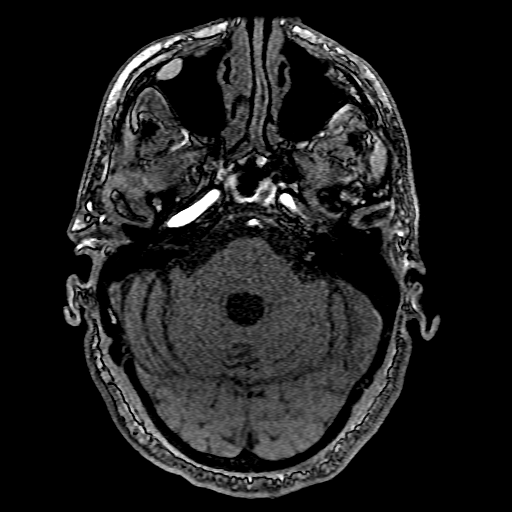
[im 79/154]
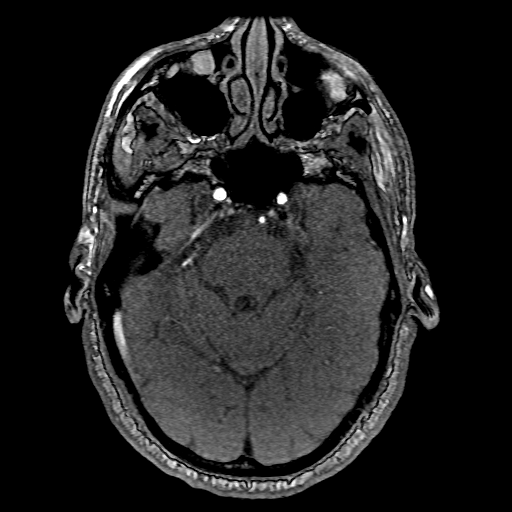
[im 88/154]
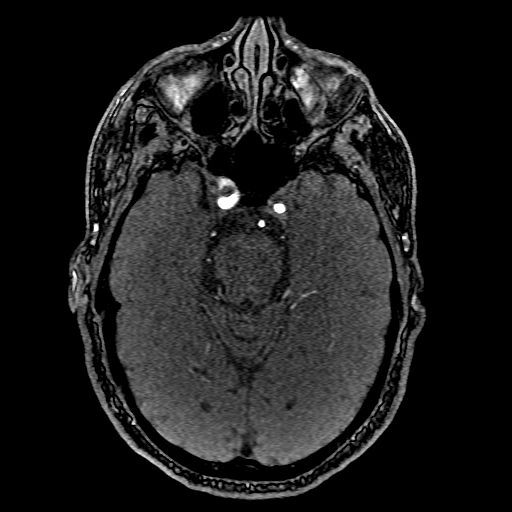
[im 108/154]
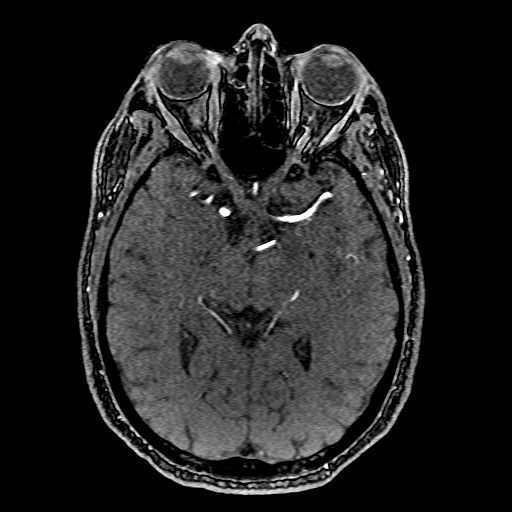
[im 127/154]
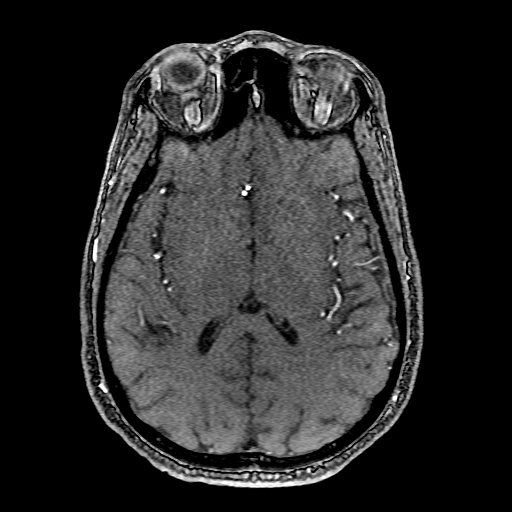
[im 131/154]
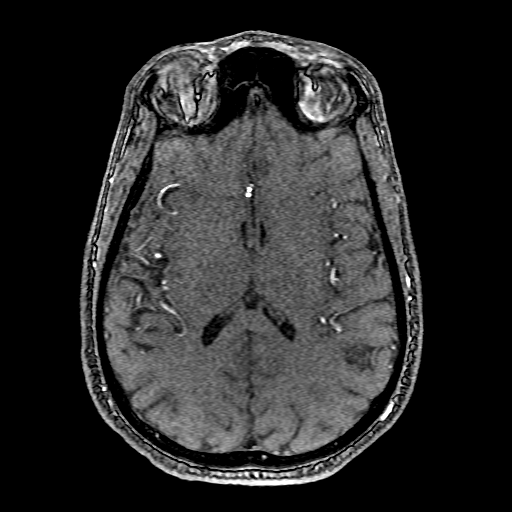
[im 147/154]
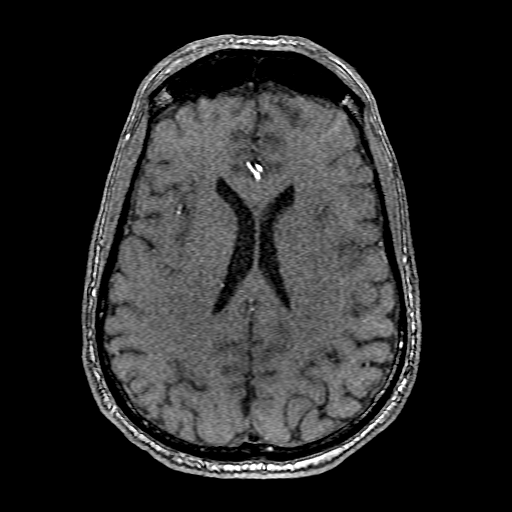

[19 of 48 positions shown; findings below may reference images not displayed]

FINDINGS: ANTERIOR CIRCULATION:

Visualized distal cervical segments of the internal carotid arteries
are patent with antegrade flow. The petrous, cavernous, and supra
clinoid segments are well opacified bilaterally. Mild ectasia of the
cavernous ICAs without focal aneurysm. A1 segments are widely
patent. Anterior communicating artery and anterior cerebral arteries
widely patent. M1 segments well opacified without stenosis or
occlusion. MCA bifurcations normal. Distal MCA branches patent
bilaterally.

POSTERIOR CIRCULATION:

Left vertebral artery slightly dominant. Vertebral arteries are
patent to the vertebrobasilar junction. Posterior inferior cerebral
arteries patent bilaterally. Basilar artery mildly tortuous but
widely patent. Superior cerebellar arteries patent bilaterally.
Fetal origin of the right PCA with widely patent right posterior
communicating artery. Left P1 segment widely patent. Posterior
cerebral arteries are well opacified to their distal aspects.

No aneurysm or vascular malformation.
IMPRESSION: Negative MRA of the intracranial circulation.

## 2017-02-09 ENCOUNTER — Encounter (HOSPITAL_COMMUNITY): Payer: Self-pay | Admitting: Emergency Medicine

## 2017-02-09 ENCOUNTER — Ambulatory Visit (HOSPITAL_COMMUNITY)
Admission: EM | Admit: 2017-02-09 | Discharge: 2017-02-09 | Disposition: A | Discharge status: Home / Self Care | Payer: BC Managed Care – PPO | Attending: Family Medicine | Admitting: Family Medicine

## 2017-02-09 DIAGNOSIS — M79602 Pain in left arm: Secondary | ICD-10-CM

## 2017-02-09 DIAGNOSIS — J0100 Acute maxillary sinusitis, unspecified: Secondary | ICD-10-CM | POA: Diagnosis not present

## 2017-02-09 MED ORDER — MELOXICAM 7.5 MG PO TABS: 7.5000 mg | ORAL_TABLET | Freq: Every day | ORAL | 0 refills | Status: DC

## 2017-02-09 MED ORDER — AMOXICILLIN 875 MG PO TABS: 875.0000 mg | ORAL_TABLET | Freq: Two times a day (BID) | ORAL | 0 refills | Status: DC

## 2017-02-09 NOTE — ED Triage Notes (Addendum)
Right arm pain for a week.  Numbness at shoulder, numbness and sharp pain in left upper arm  Denies chest pain

## 2017-02-09 NOTE — ED Triage Notes (Signed)
Patient just started chantix 2 days ago.  Patient has had a friend die recently and is very anxious

## 2017-02-09 NOTE — ED Provider Notes (Signed)
[facility]    CSN: [id number] Arrival date & time: 02/09/17  1148     History   Chief Complaint Chief Complaint  Patient presents with  . Arm Pain    HPI Patrick Chavez is a 61 y.o. male.   61 year old man who presents with anxiety and left upper arm discomfort for the last 2 weeks. He says that he also has pain in his left side and left hip. 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 He does complain about sinus congestion and postnasal drainage with some bloody nasal discharge at times.  Patient just started chantix 2 days ago.  Patient has had a friend die recently and is very anxious Right arm pain for a week.  Numbness at shoulder, numbness and sharp pain in left upper arm  Denies chest pain        Past Medical History:  Diagnosis Date  . Hepatitis C last year   [facility]  . Hypertension     Patient Active Problem List   Diagnosis Date Noted  . Left facial numbness 09/15/2015  . 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 TIA (transient ischemic attack) 09/15/2015    Past Surgical History:  Procedure Laterality Date  . TONSILLECTOMY         Home Medications    Prior to Admission medications   Medication Sig Start Date End Date Taking? Authorizing Provider  TESTOSTERONE IM Inject into the muscle.   Yes Historical Provider, MD  acetaminophen-codeine (TYLENOL #3) 300-30 MG tablet Take 1 tablet by mouth every 8 (eight) hours as needed for moderate pain (headache). 09/16/15   Richarda OverlieNayana Abrol, MD  amLODipine (NORVASC) 5 MG tablet Take 5 mg by mouth daily.    Historical Provider, MD  amoxicillin (AMOXIL) 875 MG tablet Take 1 tablet (875 mg total) by mouth 2 (two) times daily. 02/09/17   Elvina SidleKurt Tymon Nemetz, MD  aspirin 81 MG tablet Take 1 tablet (81 mg total) by mouth daily. 09/16/15   Richarda OverlieNayana Abrol, MD  meloxicam (MOBIC) 7.5 MG tablet Take 1 tablet (7.5 mg total) by mouth daily. 02/09/17   Elvina SidleKurt Haili Donofrio, MD  Multiple Vitamin  (MULTIVITAMIN WITH MINERALS) TABS Take 1 tablet by mouth daily.    Historical Provider, MD  senna-docusate (SENOKOT-S) 8.6-50 MG tablet Take 1 tablet by mouth at bedtime as needed for moderate constipation. 09/16/15   Richarda OverlieNayana Abrol, MD  varenicline (CHANTIX) 1 MG tablet Take 1 mg by mouth 2 (two) times daily.    Historical Provider, MD    Family History No family history on file.  Social History Social History  Substance Use Topics  . Smoking status: Current Every Day Smoker  . Smokeless tobacco: Never Used  . Alcohol use No     Allergies   Patient has no known allergies.   Review of Systems Review of Systems  Constitutional: Negative.   Musculoskeletal: Positive for myalgias.  All other systems reviewed and are negative.    Physical Exam Triage Vital Signs ED Triage Vitals  Enc Vitals Group     BP 02/09/17 1302 135/85     Pulse Rate 02/09/17 1302 86     Resp 02/09/17 1302 18     Temp 02/09/17 1302 98 F (36.7 C)     Temp Source 02/09/17 1302 Oral     SpO2 02/09/17 1302 96 %     Weight --      Height --  Head Circumference --      Peak Flow --      Pain Score 02/09/17 1258 7     Pain Loc --      Pain Edu? --      Excl. in GC? --    No data found.   Updated Vital Signs BP 135/85 (BP Location: Right Arm)   Pulse 86   Temp 98 F (36.7 C) (Oral)   Resp 18   SpO2 96%    Physical Exam  Constitutional: He is oriented to person, place, and time. He appears well-developed and well-nourished.  HENT:  Head: Normocephalic.  Right Ear: External ear normal.  Left Ear: External ear normal.  Mouth/Throat: Oropharynx is clear and moist.  Eyes: Conjunctivae and EOM are normal. Pupils are equal, round, and reactive to light.  Neck: Normal range of motion. Neck supple.  Cardiovascular: Normal rate, regular rhythm and normal heart sounds.   Pulmonary/Chest: Effort normal and breath sounds normal.  Abdominal: Soft. Bowel sounds are normal.  Musculoskeletal: Normal  range of motion.  Neurological: He is alert and oriented to person, place, and time.  Skin: Skin is warm and dry.  Nursing note and vitals reviewed.    UC Treatments / Results  Labs (all labs ordered are listed, but only abnormal results are displayed) Labs Reviewed - No data to display  EKG NSR on tracing  Radiology No results found.  Procedures Procedures (including critical care time)  Medications Ordered in UC Medications - No data to display   Initial Impression / Assessment and Plan / UC Course  I have reviewed the triage vital signs and the nursing notes.  Pertinent labs & imaging results that were available during my care of the patient were reviewed by me and considered in my medical decision making (see chart for details).   Final Clinical Impressions(s) / UC Diagnoses   Final diagnoses:  Left arm pain  Subacute maxillary sinusitis    New Prescriptions New Prescriptions   AMOXICILLIN (AMOXIL) 875 MG TABLET    Take 1 tablet (875 mg total) by mouth 2 (two) times daily.   MELOXICAM (MOBIC) 7.5 MG TABLET    Take 1 tablet (7.5 mg total) by mouth daily.     Elvina Sidle, MD 02/09/17 [phone]

## 2017-11-15 ENCOUNTER — Other Ambulatory Visit: Payer: Self-pay

## 2017-11-15 ENCOUNTER — Emergency Department (HOSPITAL_BASED_OUTPATIENT_CLINIC_OR_DEPARTMENT_OTHER)
Admission: EM | Admit: 2017-11-15 | Discharge: 2017-11-15 | Disposition: A | Discharge status: Home / Self Care | Payer: BC Managed Care – PPO | Attending: Emergency Medicine | Admitting: Emergency Medicine

## 2017-11-15 DIAGNOSIS — J069 Acute upper respiratory infection, unspecified: Secondary | ICD-10-CM | POA: Diagnosis not present

## 2017-11-15 DIAGNOSIS — Z7982 Long term (current) use of aspirin: Secondary | ICD-10-CM | POA: Diagnosis not present

## 2017-11-15 DIAGNOSIS — B9789 Other viral agents as the cause of diseases classified elsewhere: Secondary | ICD-10-CM | POA: Insufficient documentation

## 2017-11-15 DIAGNOSIS — F17200 Nicotine dependence, unspecified, uncomplicated: Secondary | ICD-10-CM | POA: Insufficient documentation

## 2017-11-15 DIAGNOSIS — I10 Essential (primary) hypertension: Secondary | ICD-10-CM | POA: Diagnosis not present

## 2017-11-15 DIAGNOSIS — R51 Headache: Secondary | ICD-10-CM | POA: Diagnosis present

## 2017-11-15 MED ORDER — OXYMETAZOLINE HCL 0.05 % NA SOLN
1.0000 | Freq: Once | NASAL | Status: AC
  Administered 2017-11-15: 1 via NASAL
  Filled 2017-11-15: qty 15

## 2017-11-15 MED ORDER — AZITHROMYCIN 250 MG PO TABS: 250.0000 mg | ORAL_TABLET | Freq: Every day | ORAL | 0 refills | Status: DC

## 2017-11-15 NOTE — Discharge Instructions (Signed)
Use Afrin nasal spray 2 times a day for the next 3 days and then stop.  If need more medication for congestion use Netty Pot or saline spray during the day.  Also ok to use mucinex

## 2017-11-15 NOTE — ED Provider Notes (Signed)
[facility] EMERGENCY DEPARTMENT Provider Note   CSN: [id number] Arrival date & time: 11/15/17  0920     History   Chief Complaint Chief Complaint  Patient presents with  . Facial Pain    HPI Patrick Chavez is a 62 y.o. male.  The history is provided by the patient.  URI   This is a new problem. Episode onset: 4 days. The problem has not changed since onset.There has been no fever. Associated symptoms include congestion, ear pain, plugged ear sensation, rhinorrhea, sinus pain, sneezing and cough. Pertinent negatives include no abdominal pain, no diarrhea, no nausea, no vomiting and no wheezing. Associated symptoms comments: myalgias. Treatments tried: mucinex. The treatment provided no relief.    Past Medical History:  Diagnosis Date  . Hepatitis C last year   [facility]  . Hypertension     Patient Active Problem List   Diagnosis Date Noted  . Left facial numbness 09/15/2015  . 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 TIA (transient ischemic attack) 09/15/2015    Past Surgical History:  Procedure Laterality Date  . TONSILLECTOMY         Home Medications    Prior to Admission medications   Medication Sig Start Date End Date Taking? Authorizing Provider  acetaminophen-codeine (TYLENOL #3) 300-30 MG tablet Take 1 tablet by mouth every 8 (eight) hours as needed for moderate pain (headache). 09/16/15   Richarda OverlieAbrol, Nayana, MD  amLODipine (NORVASC) 5 MG tablet Take 5 mg by mouth daily.    [provider]  amoxicillin (AMOXIL) 875 MG tablet Take 1 tablet (875 mg total) by mouth 2 (two) times daily. 02/09/17   Elvina SidleLauenstein, Kurt, MD  aspirin 81 MG tablet Take 1 tablet (81 mg total) by mouth daily. 09/16/15   Richarda OverlieAbrol, Nayana, MD  meloxicam (MOBIC) 7.5 MG tablet Take 1 tablet (7.5 mg total) by mouth daily. 02/09/17   Elvina SidleLauenstein, Kurt, MD  Multiple Vitamin (MULTIVITAMIN WITH MINERALS) TABS Take 1 tablet by mouth daily.    [provider]    senna-docusate (SENOKOT-S) 8.6-50 MG tablet Take 1 tablet by mouth at bedtime as needed for moderate constipation. 09/16/15   Richarda OverlieAbrol, Nayana, MD  TESTOSTERONE IM Inject into the muscle.    [provider]  varenicline (CHANTIX) 1 MG tablet Take 1 mg by mouth 2 (two) times daily.    [provider]    Family History No family history on file.  Social History Social History   Tobacco Use  . Smoking status: Current Every Day Smoker  . Smokeless tobacco: Never Used  Substance Use Topics  . Alcohol use: No  . Drug use: No     Allergies   Patient has no known allergies.   Review of Systems Review of Systems  HENT: Positive for congestion, ear pain, rhinorrhea, sinus pain and sneezing.   Respiratory: Positive for cough. Negative for wheezing.   Gastrointestinal: Negative for abdominal pain, diarrhea, nausea and vomiting.  All other systems reviewed and are negative.    Physical Exam Updated Vital Signs BP 124/90 (BP Location: Right Arm)   Pulse 92   Temp 98.3 F (36.8 C) (Oral)   Resp 20   SpO2 97%   Physical Exam  Constitutional: He is oriented to person, place, and time. He appears well-developed and well-nourished. No distress.  HENT:  Head: Normocephalic and atraumatic.  Right Ear: A middle ear effusion is present.  Left Ear: Tympanic membrane normal.  Nose: Mucosal edema  and rhinorrhea present.  Mouth/Throat: Oropharynx is clear and moist and mucous membranes are normal.  Eyes: Conjunctivae and EOM are normal. Pupils are equal, round, and reactive to light.  Neck: Normal range of motion. Neck supple.  Cardiovascular: Normal rate, regular rhythm and intact distal pulses.  No murmur heard. Pulmonary/Chest: Effort normal and breath sounds normal. No respiratory distress. He has no wheezes. He has no rales.  Abdominal: Soft. He exhibits no distension. There is no tenderness. There is no rebound and no guarding.  Musculoskeletal: Normal range of  motion. He exhibits no edema or tenderness.  Neurological: He is alert and oriented to person, place, and time.  Skin: Skin is warm and dry. No rash noted. No erythema.  Psychiatric: He has a normal mood and affect. His behavior is normal.  Nursing note and vitals reviewed.    ED Treatments / Results  Labs (all labs ordered are listed, but only abnormal results are displayed) Labs Reviewed - No data to display  EKG  EKG Interpretation None       Radiology No results found.  Procedures Procedures (including critical care time)  Medications Ordered in ED Medications  oxymetazoline (AFRIN) 0.05 % nasal spray 1 spray (not administered)     Initial Impression / Assessment and Plan / ED Course  I have reviewed the triage vital signs and the nursing notes.  Pertinent labs & imaging results that were available during my care of the patient were reviewed by me and considered in my medical decision making (see chart for details).     Pt with symptoms consistent with viral URI/sinusitis.  Well appearing here.  No signs of breathing difficulty  No signs of pharyngitis, otitis or abnormal abdominal findings.    pt to return with any further problems.  Pt given abx to start in 3 days if no better   Final Clinical Impressions(s) / ED Diagnoses   Final diagnoses:  Viral upper respiratory tract infection    ED Discharge Orders        Ordered    azithromycin (ZITHROMAX) 250 MG tablet  Daily     11/15/17 1013       Gwyneth Sprout, MD 11/15/17 1013

## 2018-12-30 ENCOUNTER — Encounter (HOSPITAL_BASED_OUTPATIENT_CLINIC_OR_DEPARTMENT_OTHER): Payer: Self-pay | Admitting: Emergency Medicine

## 2018-12-30 ENCOUNTER — Emergency Department (HOSPITAL_BASED_OUTPATIENT_CLINIC_OR_DEPARTMENT_OTHER)
Admission: EM | Admit: 2018-12-30 | Discharge: 2018-12-30 | Disposition: A | Discharge status: Home / Self Care | Payer: BC Managed Care – PPO | Attending: Emergency Medicine | Admitting: Emergency Medicine

## 2018-12-30 ENCOUNTER — Other Ambulatory Visit: Payer: Self-pay

## 2018-12-30 DIAGNOSIS — R52 Pain, unspecified: Secondary | ICD-10-CM | POA: Diagnosis present

## 2018-12-30 DIAGNOSIS — J209 Acute bronchitis, unspecified: Secondary | ICD-10-CM | POA: Diagnosis not present

## 2018-12-30 DIAGNOSIS — Z7982 Long term (current) use of aspirin: Secondary | ICD-10-CM | POA: Insufficient documentation

## 2018-12-30 DIAGNOSIS — I10 Essential (primary) hypertension: Secondary | ICD-10-CM | POA: Insufficient documentation

## 2018-12-30 DIAGNOSIS — F17210 Nicotine dependence, cigarettes, uncomplicated: Secondary | ICD-10-CM | POA: Insufficient documentation

## 2018-12-30 DIAGNOSIS — Z79899 Other long term (current) drug therapy: Secondary | ICD-10-CM | POA: Diagnosis not present

## 2018-12-30 MED ORDER — AZITHROMYCIN 250 MG PO TABS: 250.0000 mg | ORAL_TABLET | Freq: Every day | ORAL | 0 refills | Status: DC

## 2018-12-30 MED ORDER — AZITHROMYCIN 250 MG PO TABS
500.0000 mg | ORAL_TABLET | Freq: Once | ORAL | Status: AC
  Administered 2018-12-30: 500 mg via ORAL
  Filled 2018-12-30: qty 2

## 2018-12-30 NOTE — ED Notes (Signed)
ED Provider at bedside. 

## 2018-12-30 NOTE — Discharge Instructions (Addendum)
Zithromax as prescribed.  Continue over-the-counter medications as needed for relief of your symptoms.  Follow-up with your primary doctor if symptoms or not improving in the next week.

## 2018-12-30 NOTE — ED Provider Notes (Signed)
[facility]ENC[facility]PARTMENT Provider Note   CSN: [id number] Arrival date & time: 12/30/18  0537     History   Chief Complaint Chief Complaint  Patient presents with  . Generalized Body Aches    HPI Patrick Chavez is a 63 y.o. male.  Patient is a 63 year old male with history of hypertension, hepatitis C.  He presents today for evaluation of cough and congestion.  This is been ongoing for the past week and worsening.  He has tried over-the-counter medications with no relief.  He reports getting a flu shot this year.  He does smoke.  The history is provided by the patient.    Past Medical History:  Diagnosis Date  . Hepatitis C last year   Has appt for treatment at [facility] in January  . Hypertension     Patient Active Problem List   Diagnosis Date Noted  . Left facial numbness 09/15/2015  . 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 TIA (transient ischemic attack) 09/15/2015    Past Surgical History:  Procedure Laterality Date  . TONSILLECTOMY          Home Medications    Prior to Admission medications   Medication Sig Start Date End Date Taking? Authorizing Provider  acetaminophen-codeine (TYLENOL #3) 300-30 MG tablet Take 1 tablet by mouth every 8 (eight) hours as needed for moderate pain (headache). 09/16/15   Richarda Overlie, MD  amLODipine (NORVASC) 5 MG tablet Take 5 mg by mouth daily.    [provider]  amoxicillin (AMOXIL) 875 MG tablet Take 1 tablet (875 mg total) by mouth 2 (two) times daily. 02/09/17   Elvina Sidle, MD  aspirin 81 MG tablet Take 1 tablet (81 mg total) by mouth daily. 09/16/15   Richarda Overlie, MD  azithromycin (ZITHROMAX) 250 MG tablet Take 1 tablet (250 mg total) by mouth daily. Take first 2 tablets together, then 1 every day until finished.  Start in 3 days if no better on 11/18/17 11/15/17   Gwyneth Sprout, MD  meloxicam (MOBIC) 7.5 MG tablet Take 1 tablet (7.5 mg total) by mouth daily. 02/09/17   Elvina Sidle, MD  Multiple  Vitamin (MULTIVITAMIN WITH MINERALS) TABS Take 1 tablet by mouth daily.    [provider]  senna-docusate (SENOKOT-S) 8.6-50 MG tablet Take 1 tablet by mouth at bedtime as needed for moderate constipation. 09/16/15   Richarda Overlie, MD  TESTOSTERONE IM Inject into the muscle.    [provider]  varenicline (CHANTIX) 1 MG tablet Take 1 mg by mouth 2 (two) times daily.    [provider]    Family History Family History  Problem Relation Age of Onset  . Cancer Mother   . Glaucoma Mother   . Cancer Father   . Hypertension Other     Social History Social History   Tobacco Use  . Smoking status: Current Every Day Smoker    Packs/day: 1.00    Types: Cigarettes  . Smokeless tobacco: Never Used  Substance Use Topics  . Alcohol use: No  . Drug use: No     Allergies   Patient has no known allergies.   Review of Systems Review of Systems  All other systems reviewed and are negative.    Physical Exam Updated Vital Signs BP (!) 144/87 (BP Location: Right Arm)   Pulse 81   Temp 98.6 F (37 C) (Oral)   Resp 19   Ht 6\' 3"  (1.905 m)   Wt 99.3 kg   SpO2  96%   BMI 27.37 kg/m   Physical Exam Vitals signs and nursing note reviewed.  Constitutional:      General: He is not in acute distress.    Appearance: He is well-developed. He is not diaphoretic.  HENT:     Head: Normocephalic and atraumatic.     Mouth/Throat:     Mouth: Mucous membranes are moist.     Pharynx: No oropharyngeal exudate.  Neck:     Musculoskeletal: Normal range of motion and neck supple.  Cardiovascular:     Rate and Rhythm: Normal rate and regular rhythm.     Heart sounds: No murmur. No friction rub.  Pulmonary:     Effort: Pulmonary effort is normal. No respiratory distress.     Breath sounds: Normal breath sounds. No wheezing or rales.  Abdominal:     General: Bowel sounds are normal. There is no distension.     Palpations: Abdomen is soft.     Tenderness: There is  no abdominal tenderness.  Musculoskeletal: Normal range of motion.  Skin:    General: Skin is warm and dry.  Neurological:     Mental Status: He is alert and oriented to person, place, and time.     Coordination: Coordination normal.      ED Treatments / Results  Labs (all labs ordered are listed, but only abnormal results are displayed) Labs Reviewed - No data to display  EKG None  Radiology No results found.  Procedures Procedures (including critical care time)  Medications Ordered in ED Medications - No data to display   Initial Impression / Assessment and Plan / ED Course  I have reviewed the triage vital signs and the nursing notes.  Pertinent labs & imaging results that were available during my care of the patient were reviewed by me and considered in my medical decision making (see chart for details).  Patient with worsening symptoms despite over-the-counter medications for 1 week.  He will be treated with Zithromax for presumed bronchitis.  He is to continue over-the-counter medications as needed for relief of symptoms.  Final Clinical Impressions(s) / ED Diagnoses   Final diagnoses:  None    ED Discharge Orders    None       Geoffery Lyons, MD 12/30/18 [phone]

## 2018-12-30 NOTE — ED Triage Notes (Signed)
Pt states he has been sick for about a week  Pt states he no longer has a fever but he has body aches, joint pain and a cough

## 2019-09-18 ENCOUNTER — Other Ambulatory Visit: Payer: Self-pay

## 2019-09-18 DIAGNOSIS — Z20822 Contact with and (suspected) exposure to covid-19: Secondary | ICD-10-CM

## 2019-09-19 LAB — NOVEL CORONAVIRUS, NAA: SARS-CoV-2, NAA: NOT DETECTED

## 2019-09-20 ENCOUNTER — Ambulatory Visit (HOSPITAL_COMMUNITY)
Admission: EM | Admit: 2019-09-20 | Discharge: 2019-09-20 | Disposition: A | Discharge status: Home / Self Care | Payer: BC Managed Care – PPO | Attending: Family Medicine | Admitting: Family Medicine

## 2019-09-20 ENCOUNTER — Encounter (HOSPITAL_COMMUNITY): Payer: Self-pay

## 2019-09-20 ENCOUNTER — Other Ambulatory Visit: Payer: Self-pay

## 2019-09-20 DIAGNOSIS — J0101 Acute recurrent maxillary sinusitis: Secondary | ICD-10-CM

## 2019-09-20 MED ORDER — AMOXICILLIN 875 MG PO TABS: 875.0000 mg | ORAL_TABLET | Freq: Two times a day (BID) | ORAL | 0 refills | Status: DC

## 2019-09-20 MED ORDER — TRIAMCINOLONE ACETONIDE 55 MCG/ACT NA AERO: 2.0000 | INHALATION_SPRAY | Freq: Every day | NASAL | 0 refills | Status: DC

## 2019-09-20 NOTE — ED Provider Notes (Signed)
[facility]    CSN: [id number] Arrival date & time: 09/20/19  1204      History   Chief Complaint Chief Complaint  Patient presents with  . Facial Pain    HPI Patrick Chavez is a 63 y.o. male.   HPI   Recurring allergies and sinus infections, at least 2-3 times a year.  Tried to treat at home ut it has been 7-8 days andhe is still congested and has thick PND, face pressure and pain, nasal congestion.  No headache or body aches. COVID testing negative 11/3 Smoker, emphasized need to quit  Past Medical History:  Diagnosis Date  . Hepatitis C last year   [facility]  . Hypertension     Patient Active Problem List   Diagnosis Date Noted  . Left facial numbness 09/15/2015  . 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 TIA (transient ischemic attack) 09/15/2015    Past Surgical History:  Procedure Laterality Date  . TONSILLECTOMY         Home Medications    Prior to Admission medications   Medication Sig Start Date End Date Taking? Authorizing Provider  amLODipine (NORVASC) 5 MG tablet Take 5 mg by mouth daily.    [provider]  amoxicillin (AMOXIL) 875 MG tablet Take 1 tablet (875 mg total) by mouth 2 (two) times daily. 09/20/19   Eustace MooreNelson, Rickie Gutierres Sue, MD  aspirin 81 MG tablet Take 1 tablet (81 mg total) by mouth daily. 09/16/15   Richarda OverlieAbrol, Nayana, MD  Multiple Vitamin (MULTIVITAMIN WITH MINERALS) TABS Take 1 tablet by mouth daily.    [provider]  TESTOSTERONE IM Inject into the muscle.    [provider]  triamcinolone (NASACORT) 55 MCG/ACT AERO nasal inhaler Place 2 sprays into the nose daily. 09/20/19   Eustace MooreNelson, Chasitty Hehl Sue, MD    Family History Family History  Problem Relation Age of Onset  . Cancer Mother   . Glaucoma Mother   . Cancer Father   . Hypertension Other     Social History Social History   Tobacco Use  . Smoking status: Current Every Day Smoker    Packs/day: 1.00    Types: Cigarettes  .  Smokeless tobacco: Never Used  Substance Use Topics  . Alcohol use: No  . Drug use: No     Allergies   Patient has no known allergies.   Review of Systems Review of Systems  Constitutional: Positive for fatigue. Negative for chills and fever.  HENT: Positive for congestion, postnasal drip, rhinorrhea, sinus pressure, sinus pain and sore throat. Negative for ear pain.   Eyes: Negative for pain and visual disturbance.  Respiratory: Negative for cough and shortness of breath.   Cardiovascular: Negative for chest pain and palpitations.  Gastrointestinal: Negative for abdominal pain and vomiting.  Genitourinary: Negative for dysuria and hematuria.  Musculoskeletal: Negative for arthralgias, back pain and myalgias.  Skin: Negative for color change and rash.  Neurological: Positive for headaches. Negative for seizures and syncope.  All other systems reviewed and are negative.    Physical Exam Triage Vital Signs ED Triage Vitals  Enc Vitals Group     BP 09/20/19 1216 (!) 146/91     Pulse Rate 09/20/19 1216 98     Resp 09/20/19 1216 18     Temp 09/20/19 1216 98.4 F (36.9 C)     Temp Source 09/20/19 1216 Oral     SpO2 09/20/19 1216 97 %  Weight 09/20/19 1217 215 lb (97.5 kg)     Height --      Head Circumference --      Peak Flow --      Pain Score 09/20/19 1217 0     Pain Loc --      Pain Edu? --      Excl. in GC? --    No data found.  Updated Vital Signs BP (!) 146/91 (BP Location: Right Arm)   Pulse 98   Temp 98.4 F (36.9 C) (Oral)   Resp 18   Wt 97.5 kg   SpO2 97%   BMI 26.87 kg/m      Physical Exam Constitutional:      General: He is not in acute distress.    Appearance: He is well-developed and normal weight. He is ill-appearing.  HENT:     Head: Normocephalic and atraumatic.     Right Ear: Tympanic membrane, ear canal and external ear normal.     Left Ear: Tympanic membrane, ear canal and external ear normal.     Nose: Congestion and rhinorrhea  present.     Comments: Face sinuses tender    Mouth/Throat:     Mouth: Mucous membranes are moist.     Pharynx: No posterior oropharyngeal erythema.     Comments: mask Eyes:     Conjunctiva/sclera: Conjunctivae normal.     Pupils: Pupils are equal, round, and reactive to light.  Neck:     Musculoskeletal: Normal range of motion.  Cardiovascular:     Rate and Rhythm: Normal rate.     Heart sounds: Normal heart sounds.  Pulmonary:     Effort: Pulmonary effort is normal. No respiratory distress.     Breath sounds: Normal breath sounds.  Abdominal:     General: There is no distension.     Palpations: Abdomen is soft.  Musculoskeletal: Normal range of motion.  Lymphadenopathy:     Cervical: Cervical adenopathy present.  Skin:    General: Skin is warm and dry.  Neurological:     Mental Status: He is alert.  Psychiatric:        Mood and Affect: Mood normal.        Behavior: Behavior normal.      UC Treatments / Results  Labs (all labs ordered are listed, but only abnormal results are displayed) Labs Reviewed - No data to display  EKG   Radiology No results found.  Procedures Procedures (including critical care time)  Medications Ordered in UC Medications - No data to display  Initial Impression / Assessment and Plan / UC Course  I have reviewed the triage vital signs and the nursing notes.  Pertinent labs & imaging results that were available during my care of the patient were reviewed by me and considered in my medical decision making (see chart for details).      I reviewed with this patient the CDC recommendations that sinus infections and respiratory infections are likely viral.  Would  clear without antibiotics.  Since he has been 7 to 8 days, and feels like he is getting worse, will give a trial of antibiotic.  I explained that this would not be indicated if he had cold symptoms for less than a week. Final Clinical Impressions(s) / UC Diagnoses   Final  diagnoses:  Acute recurrent maxillary sinusitis     Discharge Instructions     Continue to drink lots of fluids Use the nasacort 2 x a day until improvement  then once a day Take the antibiotic 2 x a day for a week Return as needed   ED Prescriptions    Medication Sig Dispense Auth. Provider   triamcinolone (NASACORT) 55 MCG/ACT AERO nasal inhaler Place 2 sprays into the nose daily. 1 Inhaler Raylene Everts, MD   amoxicillin (AMOXIL) 875 MG tablet Take 1 tablet (875 mg total) by mouth 2 (two) times daily. 14 tablet Raylene Everts, MD     PDMP not reviewed this encounter.   Raylene Everts, MD 09/20/19 1308

## 2019-09-20 NOTE — Discharge Instructions (Signed)
Continue to drink lots of fluids Use the nasacort 2 x a day until improvement then once a day Take the antibiotic 2 x a day for a week Return as needed

## 2019-09-20 NOTE — ED Triage Notes (Signed)
Pt states she has sinus pressure and drainage. X 1 week

## 2019-11-26 ENCOUNTER — Ambulatory Visit: Payer: BC Managed Care – PPO | Attending: Internal Medicine

## 2019-11-26 DIAGNOSIS — Z20822 Contact with and (suspected) exposure to covid-19: Secondary | ICD-10-CM

## 2019-11-28 LAB — NOVEL CORONAVIRUS, NAA: SARS-CoV-2, NAA: NOT DETECTED

## 2022-07-30 DIAGNOSIS — H269 Unspecified cataract: Secondary | ICD-10-CM | POA: Diagnosis not present

## 2022-07-30 DIAGNOSIS — H409 Unspecified glaucoma: Secondary | ICD-10-CM | POA: Diagnosis not present

## 2022-07-30 DIAGNOSIS — H2511 Age-related nuclear cataract, right eye: Secondary | ICD-10-CM | POA: Diagnosis not present

## 2022-08-12 ENCOUNTER — Ambulatory Visit: Payer: BC Managed Care – PPO | Admitting: Internal Medicine

## 2022-08-20 ENCOUNTER — Encounter: Payer: Self-pay | Admitting: Internal Medicine

## 2022-08-20 ENCOUNTER — Ambulatory Visit (INDEPENDENT_AMBULATORY_CARE_PROVIDER_SITE_OTHER): Payer: Medicare Other | Admitting: Internal Medicine

## 2022-08-20 VITALS — BP 128/84 | HR 94 | Temp 97.6°F | Ht 74.0 in | Wt 203.0 lb

## 2022-08-20 DIAGNOSIS — Z23 Encounter for immunization: Secondary | ICD-10-CM | POA: Diagnosis not present

## 2022-08-20 DIAGNOSIS — F17200 Nicotine dependence, unspecified, uncomplicated: Secondary | ICD-10-CM

## 2022-08-20 DIAGNOSIS — N138 Other obstructive and reflux uropathy: Secondary | ICD-10-CM

## 2022-08-20 DIAGNOSIS — B1920 Unspecified viral hepatitis C without hepatic coma: Secondary | ICD-10-CM | POA: Insufficient documentation

## 2022-08-20 DIAGNOSIS — M159 Polyosteoarthritis, unspecified: Secondary | ICD-10-CM | POA: Insufficient documentation

## 2022-08-20 DIAGNOSIS — I10 Essential (primary) hypertension: Secondary | ICD-10-CM

## 2022-08-20 DIAGNOSIS — M1611 Unilateral primary osteoarthritis, right hip: Secondary | ICD-10-CM

## 2022-08-20 DIAGNOSIS — N401 Enlarged prostate with lower urinary tract symptoms: Secondary | ICD-10-CM | POA: Diagnosis not present

## 2022-08-20 MED ORDER — SILDENAFIL CITRATE 100 MG PO TABS: 100.0000 mg | ORAL_TABLET | Freq: Every day | ORAL | 11 refills | Status: DC | PRN

## 2022-08-20 MED ORDER — TAMSULOSIN HCL 0.4 MG PO CAPS: 0.4000 mg | ORAL_CAPSULE | Freq: Every day | ORAL | 3 refills | Status: DC

## 2022-08-20 NOTE — Assessment & Plan Note (Signed)
Fairly classic symptoms Will start tamsulosin Is set up with urology next week

## 2022-08-20 NOTE — Assessment & Plan Note (Signed)
BP Readings from Last 3 Encounters:  08/20/22 128/84  09/20/19 (!) 146/91  12/30/18 (!) 144/87   Controlled with amlodipine 10mg  daily Is using ASA daily as well

## 2022-08-20 NOTE — Assessment & Plan Note (Signed)
Has not been able to quit Will set up with lung cancer screening

## 2022-08-20 NOTE — Addendum Note (Signed)
Addended by: Pilar Grammes on: 08/20/2022 11:16 AM   Modules accepted: Orders

## 2022-08-20 NOTE — Assessment & Plan Note (Signed)
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

## 2022-08-20 NOTE — Progress Notes (Signed)
Subjective:    Patient ID: Patrick Chavez, male    DOB: Feb 10, 1956, 66 y.o.   MRN: [id number]  HPI Here to establish care  Living in [place] for 2 years Feels he is healthy Some back problems Gets some right groin pain --no hernia on recent exam Awakens with the pain in the morning---then it loosens up No sig leg weakness  Voids a lot--urgency Nocturia is variable Has to stop in the car Set up with urologist for next week No blood  On med for HTN Goes back 13 years or so Went to hospital for headache (and was anxious)--in 2016 CT angiograms were normal---doesn't sound like a TIA  Noted ED Sildenafil helps some  Still smokes Has tried chantix and patch  Current Outpatient Medications on File Prior to Visit  Medication Sig Dispense Refill   amLODipine (NORVASC) 5 MG tablet Take 5 mg by mouth daily.     aspirin 81 MG tablet Take 1 tablet (81 mg total) by mouth daily. 30 tablet 1   Multiple Vitamin (MULTIVITAMIN WITH MINERALS) TABS Take 1 tablet by mouth daily.     sildenafil (VIAGRA) 100 MG tablet Take 100 mg by mouth daily as needed for erectile dysfunction.     No current facility-administered medications on file prior to visit.    No Known Allergies  Past Medical History:  Diagnosis Date   BPH with obstruction/lower urinary tract symptoms    Hepatitis C last year   was treated   Hypertension     Past Surgical History:  Procedure Laterality Date   TONSILLECTOMY      Family History  Problem Relation Age of Onset   Hypertension Mother    Diabetes Mother    Cancer Mother    Glaucoma Mother    Pancreatic cancer Mother    Cancer Father    Throat cancer Father    Stomach cancer Father    Hypertension Other     Social History   Socioeconomic History   Marital status: Married    Spouse name: Not on file   Number of children: 7   Years of education: Not on file   Highest education level: Not on file  Occupational History   Occupation: Warehouse  work    Comment: retired   Occupation: Special needs supervision--adults    Comment: Part time  Tobacco Use   Smoking status: Every Day    Packs/day: 1.00    Types: Cigarettes    Passive exposure: Past   Smokeless tobacco: Never  Substance and Sexual Activity   Alcohol use: Yes    Comment: Rare   Drug use: No   Sexual activity: Not on file  Other Topics Concern   Not on file  Social History Narrative   3 children of his own   4 step children      No living will   Wife would be health care POA--then kids   Would accept resuscitaion   Social Determinants of Health   Financial Resource Strain: Not on file  Food Insecurity: Not on file  Transportation Needs: Not on file  Physical Activity: Not on file  Stress: Not on file  Social Connections: Not on file  Intimate Partner Violence: Not on file   Review of Systems  Constitutional:  Negative for unexpected weight change.       Wears seat belt  Eyes:  Negative for visual disturbance.       Recent cataract surgery  Respiratory:  Positive for  wheezing. Negative for cough, chest tightness and shortness of breath.   Gastrointestinal:  Negative for abdominal pain, blood in stool and constipation.  Genitourinary:  Positive for difficulty urinating, frequency and urgency.  Musculoskeletal:  Positive for back pain. Negative for joint swelling.  Skin:  Negative for rash.  Allergic/Immunologic: Negative for environmental allergies and immunocompromised state.  Neurological:  Negative for dizziness, syncope, light-headedness and headaches.  Psychiatric/Behavioral:  Negative for decreased concentration and sleep disturbance. The patient is not nervous/anxious.        Objective:   Physical Exam Constitutional:      Appearance: Normal appearance.  HENT:     Mouth/Throat:     Comments: No oral lesions Cardiovascular:     Rate and Rhythm: Normal rate and regular rhythm.     Pulses: Normal pulses.     Heart sounds: No murmur  heard.    No gallop.  Pulmonary:     Effort: Pulmonary effort is normal.     Breath sounds: Normal breath sounds. No wheezing or rales.  Abdominal:     Palpations: Abdomen is soft.     Tenderness: There is no abdominal tenderness.  Musculoskeletal:     Cervical back: Neck supple.     Right lower leg: No edema.     Left lower leg: No edema.     Comments: Very limited rotation in right hip---internal worse than external. Passive ROM recreated the pain  Lymphadenopathy:     Cervical: No cervical adenopathy.  Skin:    Findings: No lesion or rash.  Neurological:     General: No focal deficit present.     Mental Status: He is alert.  Psychiatric:        Mood and Affect: Mood normal.        Behavior: Behavior normal.            Assessment & Plan:

## 2022-09-08 ENCOUNTER — Telehealth: Payer: Self-pay | Admitting: Internal Medicine

## 2022-09-08 MED ORDER — AMLODIPINE BESYLATE 5 MG PO TABS: 5.0000 mg | ORAL_TABLET | Freq: Every day | ORAL | 1 refills | Status: DC

## 2022-09-08 NOTE — Telephone Encounter (Signed)
  Encourage patient to contact the pharmacy for refills or they can request refills through [facility]  Did the patient contact the pharmacy:  no   LAST APPOINTMENT DATE:  Please schedule appointment if longer than 1 year  NEXT APPOINTMENT DATE:02/21/2023  MEDICATION:amLODipine (NORVASC) 5 MG tablet  Is the patient out of medication? 1 more left  If not, how much is left?  Is this a 90 day supply: yes  PHARMACY: [id number] - [place], [place] - [address] Phone:  [phone]  Fax:  [phone]      Let patient know to contact pharmacy at the end of the day to make sure medication is ready.  Please notify patient to allow 48-72 hours to process

## 2022-09-08 NOTE — Telephone Encounter (Signed)
Rx sent electronically.  

## 2022-10-04 ENCOUNTER — Other Ambulatory Visit: Payer: Self-pay

## 2022-10-04 DIAGNOSIS — F17210 Nicotine dependence, cigarettes, uncomplicated: Secondary | ICD-10-CM

## 2022-10-04 DIAGNOSIS — Z122 Encounter for screening for malignant neoplasm of respiratory organs: Secondary | ICD-10-CM

## 2022-10-04 DIAGNOSIS — Z87891 Personal history of nicotine dependence: Secondary | ICD-10-CM

## 2022-11-24 ENCOUNTER — Ambulatory Visit (INDEPENDENT_AMBULATORY_CARE_PROVIDER_SITE_OTHER): Payer: Medicare Other | Admitting: Acute Care

## 2022-11-24 ENCOUNTER — Encounter: Payer: Self-pay | Admitting: Acute Care

## 2022-11-24 DIAGNOSIS — F17210 Nicotine dependence, cigarettes, uncomplicated: Secondary | ICD-10-CM

## 2022-11-24 NOTE — Progress Notes (Signed)
Virtual Visit via Telephone Note  I connected with Patrick Chavez on 11/24/22 at  9:30 AM EST by telephone and verified that I am speaking with the correct person using two identifiers.  Location: Patient:  At home Provider:  [place], [place], [place], [address]    I discussed the limitations, risks, security and privacy concerns of performing an evaluation and management service by telephone and the availability of in person appointments. I also discussed with the patient that there may be a patient responsible charge related to this service. The patient expressed understanding and agreed to proceed.   Shared Decision Making Visit Lung Cancer Screening Program [phone])   Eligibility: Age 67 y.o. Pack Years Smoking History Calculation 69 pack year smoking history (# packs/per year x # years smoked) Recent History of coughing up blood  no Unexplained weight loss? no ( >Than 15 pounds within the last 6 months ) Prior History Lung / other cancer no (Diagnosis within the last 5 years already requiring surveillance chest CT Scans). Smoking Status Current Smoker Former Smokers: Years since quit:  NA  Quit Date:  NA  Visit Components: Discussion included one or more decision making aids. yes Discussion included risk/benefits of screening. yes Discussion included potential follow up diagnostic testing for abnormal scans. yes Discussion included meaning and risk of over diagnosis. yes Discussion included meaning and risk of False Positives. yes Discussion included meaning of total radiation exposure. yes  Counseling Included: Importance of adherence to annual lung cancer LDCT screening. yes Impact of comorbidities on ability to participate in the program. yes Ability and willingness to under diagnostic treatment. yes  Smoking Cessation Counseling: Current Smokers:  Discussed importance of smoking cessation. yes Information about tobacco cessation classes and  interventions provided to patient. yes Patient provided with "ticket" for LDCT Scan. yes Symptomatic Patient. no  Counseling NA Diagnosis Code: Tobacco Use Z72.0 Asymptomatic Patient yes  Counseling (Intermediate counseling: > three minutes counseling) X9147 Former Smokers:  Discussed the importance of maintaining cigarette abstinence. yes Diagnosis Code: Personal History of Nicotine Dependence. W29.562 Information about tobacco cessation classes and interventions provided to patient. Yes Patient provided with "ticket" for LDCT Scan. yes Written Order for Lung Cancer Screening with LDCT placed in Epic. Yes (CT Chest Lung Cancer Screening Low Dose W/O CM) [id number] Z12.2-Screening of respiratory organs Z87.891-Personal history of nicotine dependence  I have spent 25 minutes of face to face/ virtual visit   time with  Mr. Hollabaugh discussing the risks and benefits of lung cancer screening. We viewed / discussed a power point together that explained in detail the above noted topics. We paused at intervals to allow for questions to be asked and answered to ensure understanding.We discussed that the single most powerful action that he can take to decrease his risk of developing lung cancer is to quit smoking. We discussed whether or not he is ready to commit to setting a quit date. We discussed options for tools to aid in quitting smoking including nicotine replacement therapy, non-nicotine medications, support groups, Quit Smart classes, and behavior modification. We discussed that often times setting smaller, more achievable goals, such as eliminating 1 cigarette a day for a week and then 2 cigarettes a day for a week can be helpful in slowly decreasing the number of cigarettes smoked. This allows for a sense of accomplishment as well as providing a clinical benefit. I provided  him  with smoking cessation  information  with contact information for community resources, classes,  free nicotine replacement  therapy, and access to mobile apps, text messaging, and on-line smoking cessation help. I have also provided  him  the office contact information in the event he needs to contact me, or the screening staff. We discussed the time and location of the scan, and that either Doroteo Glassman RN, Joella Prince, RN  or I will call / send a letter with the results within 24-72 hours of receiving them. The patient verbalized understanding of all of  the above and had no further questions upon leaving the office. They have my contact information in the event they have any further questions.  I spent 3 minutes counseling on smoking cessation and the health risks of continued tobacco abuse.  I explained to the patient that there has been a high incidence of coronary artery disease noted on these exams. I explained that this is a non-gated exam therefore degree or severity cannot be determined. This patient is not on statin therapy. I have asked the patient to follow-up with their PCP regarding any incidental finding of coronary artery disease and management with diet or medication as their PCP  feels is clinically indicated. The patient verbalized understanding of the above and had no further questions upon completion of the visit.      Magdalen Spatz, NP 11/24/2022

## 2022-11-24 NOTE — Patient Instructions (Signed)
Thank you for participating in the [facility]. It was our pleasure to meet you today. We will call you with the results of your scan within the next few days. Your scan will be assigned a Lung RADS category score by the physicians reading the scans.  This Lung RADS score determines follow up scanning.  See below for description of categories, and follow up screening recommendations. We will be in touch to schedule your follow up screening annually or based on recommendations of our providers. We will fax a copy of your scan results to your Primary Care Physician, or the physician who referred you to the program, to ensure they have the results. Please call the office if you have any questions or concerns regarding your scanning experience or results.  Our office number is [phone]. Please speak with Denise Phelps, RN. , or  Denise Buckner RN, They are  our Lung Cancer Screening RN.'s If They are unavailable when you call, Please leave a message on the voice mail. We will return your call at our earliest convenience.This voice mail is monitored several times a day.  Remember, if your scan is normal, we will scan you annually as long as you continue to meet the criteria for the program. (Age 55-77, Current smoker or smoker who has quit within the last 15 years). If you are a smoker, remember, quitting is the single most powerful action that you can take to decrease your risk of lung cancer and other pulmonary, breathing related problems. We know quitting is hard, and we are here to help.  Please let us know if there is anything we can do to help you meet your goal of quitting. If you are a former smoker, congratulations. We are proud of you! Remain smoke free! Remember you can refer friends or family members through the number above.  We will screen them to make sure they meet criteria for the program. Thank you for helping us take better care of you by  participating in Lung Screening.  You can receive free nicotine replacement therapy ( patches, gum or mints) by calling 1-800-QUIT NOW. Please call so we can get you on the path to becoming  a non-smoker. I know it is hard, but you can do this!  Lung RADS Categories:  Lung RADS 1: no nodules or definitely non-concerning nodules.  Recommendation is for a repeat annual scan in 12 months.  Lung RADS 2:  nodules that are non-concerning in appearance and behavior with a very low likelihood of becoming an active cancer. Recommendation is for a repeat annual scan in 12 months.  Lung RADS 3: nodules that are probably non-concerning , includes nodules with a low likelihood of becoming an active cancer.  Recommendation is for a 6-month repeat screening scan. Often noted after an upper respiratory illness. We will be in touch to make sure you have no questions, and to schedule your 6-month scan.  Lung RADS 4 A: nodules with concerning findings, recommendation is most often for a follow up scan in 3 months or additional testing based on our provider's assessment of the scan. We will be in touch to make sure you have no questions and to schedule the recommended 3 month follow up scan.  Lung RADS 4 B:  indicates findings that are concerning. We will be in touch with you to schedule additional diagnostic testing based on our provider's  assessment of the scan.  Other options for assistance in smoking cessation (   As covered by your insurance benefits)  Hypnosis for smoking cessation  [facility] [employer] [phone]  Acupuncture for smoking cessation  [facility] [phone]   

## 2022-11-25 ENCOUNTER — Ambulatory Visit
Admission: RE | Admit: 2022-11-25 | Discharge: 2022-11-25 | Disposition: A | Discharge status: Home / Self Care | Payer: Medicare Other | Source: Ambulatory Visit | Attending: Acute Care | Admitting: Acute Care

## 2022-11-25 DIAGNOSIS — Z87891 Personal history of nicotine dependence: Secondary | ICD-10-CM | POA: Insufficient documentation

## 2022-11-25 DIAGNOSIS — F17210 Nicotine dependence, cigarettes, uncomplicated: Secondary | ICD-10-CM | POA: Diagnosis not present

## 2022-11-25 DIAGNOSIS — Z122 Encounter for screening for malignant neoplasm of respiratory organs: Secondary | ICD-10-CM | POA: Diagnosis not present

## 2022-11-26 ENCOUNTER — Other Ambulatory Visit: Payer: Self-pay | Admitting: Acute Care

## 2022-11-26 DIAGNOSIS — Z122 Encounter for screening for malignant neoplasm of respiratory organs: Secondary | ICD-10-CM

## 2022-11-26 DIAGNOSIS — Z87891 Personal history of nicotine dependence: Secondary | ICD-10-CM

## 2022-11-26 DIAGNOSIS — F17210 Nicotine dependence, cigarettes, uncomplicated: Secondary | ICD-10-CM

## 2022-12-03 ENCOUNTER — Telehealth: Payer: Self-pay | Admitting: Internal Medicine

## 2022-12-03 MED ORDER — AMLODIPINE BESYLATE 5 MG PO TABS: 5.0000 mg | ORAL_TABLET | Freq: Every day | ORAL | 1 refills | Status: DC

## 2022-12-03 NOTE — Telephone Encounter (Signed)
Prescription Request  12/03/2022  Is this a "Controlled Substance" medicine? No  LOV: 08/20/2022  What is the name of the medication or equipment? amLODipine (NORVASC) 5 MG tablet   Have you contacted your pharmacy to request a refill? Yes   Which pharmacy would you like this sent to?  [id number] [address], [place] [address] [place] [address] Phone: [phone] Fax: [phone]     Patient notified that their request is being sent to the clinical staff for review and that they should receive a response within 2 business days.   Please advise at Mobile [phone] (mobile)

## 2022-12-06 ENCOUNTER — Ambulatory Visit (INDEPENDENT_AMBULATORY_CARE_PROVIDER_SITE_OTHER): Payer: Medicare Other | Admitting: Internal Medicine

## 2022-12-06 ENCOUNTER — Encounter: Payer: Self-pay | Admitting: Internal Medicine

## 2022-12-06 VITALS — BP 138/84 | HR 70 | Temp 97.8°F | Ht 74.0 in | Wt 208.0 lb

## 2022-12-06 DIAGNOSIS — J439 Emphysema, unspecified: Secondary | ICD-10-CM | POA: Diagnosis not present

## 2022-12-06 DIAGNOSIS — I700 Atherosclerosis of aorta: Secondary | ICD-10-CM | POA: Diagnosis not present

## 2022-12-06 MED ORDER — ROSUVASTATIN CALCIUM 5 MG PO TABS: 5.0000 mg | ORAL_TABLET | Freq: Every day | ORAL | 3 refills | Status: DC

## 2022-12-06 NOTE — Assessment & Plan Note (Signed)
Found on imaging CT Mild symptoms only--not ready for Rx or PFT's Is working on cigarette cessation

## 2022-12-06 NOTE — Assessment & Plan Note (Signed)
Again discussed cigarette cessation Still active with work, etc Discussed statin---will start rosuvastatin 5 daily. Discussed potental side effects

## 2022-12-06 NOTE — Progress Notes (Signed)
Subjective:    Patient ID: Patrick Chavez, male    DOB: October 02, 1956, 67 y.o.   MRN: [id number]  HPI Here for review of his abnormal CT--for lung cancer screening  Reviewed the test No evidence of lung cancer  Did see emphysema He does note some easier fatigue Doesn't like wearing a mask No regular cough Has cut down to 6 cigarettes per day Considering hypnosis  Current Outpatient Medications on File Prior to Visit  Medication Sig Dispense Refill   amLODipine (NORVASC) 5 MG tablet Take 1 tablet (5 mg total) by mouth daily. 90 tablet 1   aspirin 81 MG tablet Take 1 tablet (81 mg total) by mouth daily. 30 tablet 1   Multiple Vitamin (MULTIVITAMIN WITH MINERALS) TABS Take 1 tablet by mouth daily.     sildenafil (VIAGRA) 100 MG tablet Take 1 tablet (100 mg total) by mouth daily as needed for erectile dysfunction. 10 tablet 11   tamsulosin (FLOMAX) 0.4 MG CAPS capsule Take 1 capsule (0.4 mg total) by mouth daily. 30 capsule 3   No current facility-administered medications on file prior to visit.    No Known Allergies  Past Medical History:  Diagnosis Date   BPH with obstruction/lower urinary tract symptoms    Hepatitis C last year   was treated   Hypertension     Past Surgical History:  Procedure Laterality Date   CATARACT EXTRACTION W/ INTRAOCULAR LENS IMPLANT Bilateral    TONSILLECTOMY      Family History  Problem Relation Age of Onset   Hypertension Mother    Diabetes Mother    Cancer Mother    Glaucoma Mother    Pancreatic cancer Mother    Cancer Father    Throat cancer Father    Stomach cancer Father    Hypertension Other     Social History   Socioeconomic History   Marital status: Married    Spouse name: Not on file   Number of children: 7   Years of education: Not on file   Highest education level: Not on file  Occupational History   Occupation: Warehouse work    Comment: retired   Occupation: Special needs supervision--adults    Comment: Part  time  Tobacco Use   Smoking status: Every Day    Packs/day: 1.00    Types: Cigarettes    Passive exposure: Past   Smokeless tobacco: Never  Substance and Sexual Activity   Alcohol use: Yes    Comment: Rare   Drug use: No   Sexual activity: Not on file  Other Topics Concern   Not on file  Social History Narrative   3 children of his own   4 step children      No living will   Wife would be health care POA--then kids   Would accept resuscitaion   Social Determinants of Health   Financial Resource Strain: Not on file  Food Insecurity: Not on file  Transportation Needs: Not on file  Physical Activity: Not on file  Stress: Not on file  Social Connections: Not on file  Intimate Partner Violence: Not on file   Review of Systems Appetite is okay Weight stable    Objective:   Physical Exam Constitutional:      Appearance: Normal appearance.  Neurological:     Mental Status: He is alert.  Psychiatric:        Mood and Affect: Mood normal.        Behavior: Behavior normal.  Assessment & Plan:

## 2022-12-09 ENCOUNTER — Other Ambulatory Visit: Payer: Self-pay | Admitting: Internal Medicine

## 2022-12-28 ENCOUNTER — Telehealth: Payer: Self-pay

## 2022-12-28 NOTE — Telephone Encounter (Signed)
Unable to reach pt by phone; Mateo Flow (DPR signed)said pt did not go to UC because he was feeling better; pt just concerned about dark stool.Mateo Flow said pt had vomiting and diarrhea on 12/24/22. Mateo Flow said pt has been taking pepto but not sure if started pepto prior to black stools or not. No vomiting or diarrhea now just black stools and no abd pain.Valerie scheduled appt with Dr Silvio Pate 12/29/22 at 12 noon with UC & ED precautions and [facility] voice understanding.

## 2022-12-28 NOTE — Telephone Encounter (Signed)
[facility] Night - Client TELEPHONE ADVICE RECORD [facility] Patient Name: Patrick Chavez Gender: Male DOB: 04/14/1956 Age: 67 Y 11 M 20 D Return Phone Number: [phone] (Primary) Address: City/ State/ Zip: [place] [address] Client [facility] Night - Client Client Site [facility] - Night Provider Viviana Simpler- MD Contact Type Call Who Is Calling Patient / Member / Family / Caregiver Call Type Triage / Clinical Relationship To Patient Self Return Phone Number [phone] (Primary) Chief Complaint Blood In Stool Reason for Call Symptomatic / Request for [place] states wants appt to see MD; over the weekend thought had flu; stool is dark black; Translation No Nurse Assessment Nurse: Windle Guard, RN, Olin Hauser Date/Time (Eastern Time): 12/28/2022 6:31:22 AM Confirm and document reason for call. If symptomatic, describe symptoms. ---States his stool is black, started on Fri. Symptoms started with vomiting and diarrhea., this lasted only 1 day. Does the patient have any new or worsening symptoms? ---Yes Will a triage be completed? ---Yes Related visit to physician within the last 2 weeks? ---No Does the PT have any chronic conditions? (i.e. diabetes, asthma, this includes High risk factors for pregnancy, etc.) ---Yes List chronic conditions. ---HTN Is this a behavioral health or substance abuse call? ---No Guidelines Guideline Title Affirmed Question Affirmed Notes Nurse Date/Time Eilene Ghazi Time) Rectal Bleeding Age > 76 years Windle Guard, Corporate treasurer 12/28/2022 6:34:01 AM Disp. Time Eilene Ghazi Time) Disposition Final User 12/28/2022 6:38:28 AM See PCP within 2 Weeks Yes Windle Guard, RN, Olin Hauser Final Disposition 12/28/2022 6:38:28 AM See PCP within 2 Weeks Yes Conner, RN, Andree Coss NOTE: All timestamps contained within this report are represented as [place] Standard  Time. CONFIDENTIALTY NOTICE: This fax transmission is intended only for the addressee. It contains information that is legally privileged, confidential or otherwise protected from use or disclosure. If you are not the intended recipient, you are strictly prohibited from reviewing, disclosing, copying using or disseminating any of this information or taking any action in reliance on or regarding this information. If you have received this fax in error, please notify us immediately by telephone so that we can arrange for its return to [place]. Phone: [phone], Toll-Free: [phone], Fax: [phone] Page: 2 of 2 Call Id: [id number] [place] Disagree/Comply Comply Caller Understands Yes PreDisposition Go to Urgent Care/Walk-In Clinic Care Advice Given Per Guideline SEE PCP WITHIN 2 WEEKS: * You need to be seen for this ongoing problem within the next 2 weeks. REASSURANCE AND EDUCATION - MILD RECTAL BLEEDING: CALL BACK IF: * Bleeding increases in amount * You become worse CARE ADVICE given per Rectal Bleeding (Adult) guideline. Comments User: Harvin Hazel, RN Date/Time Eilene Ghazi Time): 12/28/2022 6:41:20 AM Caller states he has taken [place] last 2 days User: Harvin Hazel, RN Date/Time Eilene Ghazi Time): 12/28/2022 6:42:12 AM States stool is dark green at times and looks black other times, states stool is formed todayt, denies abdominal pain, n/v Referrals REFERRED TO PCP OFFIC

## 2022-12-28 NOTE — Telephone Encounter (Signed)
Okay--I will check him at the visit tomorrow

## 2022-12-29 ENCOUNTER — Ambulatory Visit: Payer: Medicare Other | Admitting: Internal Medicine

## 2023-01-12 ENCOUNTER — Ambulatory Visit (INDEPENDENT_AMBULATORY_CARE_PROVIDER_SITE_OTHER)
Admission: RE | Admit: 2023-01-12 | Discharge: 2023-01-12 | Disposition: A | Discharge status: Home / Self Care | Payer: Medicare Other | Source: Ambulatory Visit | Attending: Internal Medicine | Admitting: Internal Medicine

## 2023-01-12 ENCOUNTER — Encounter: Payer: Self-pay | Admitting: Internal Medicine

## 2023-01-12 ENCOUNTER — Ambulatory Visit (INDEPENDENT_AMBULATORY_CARE_PROVIDER_SITE_OTHER): Payer: Medicare Other | Admitting: Internal Medicine

## 2023-01-12 VITALS — BP 138/84 | HR 84 | Temp 97.7°F | Ht 74.0 in | Wt 206.0 lb

## 2023-01-12 DIAGNOSIS — R071 Chest pain on breathing: Secondary | ICD-10-CM | POA: Diagnosis not present

## 2023-01-12 DIAGNOSIS — R0781 Pleurodynia: Secondary | ICD-10-CM | POA: Diagnosis not present

## 2023-01-12 MED ORDER — AMLODIPINE BESYLATE 5 MG PO TABS: 5.0000 mg | ORAL_TABLET | Freq: Every day | ORAL | 3 refills | Status: DC

## 2023-01-12 MED ORDER — AZITHROMYCIN 250 MG PO TABS: ORAL_TABLET | ORAL | 0 refills | Status: DC

## 2023-01-12 NOTE — Assessment & Plan Note (Addendum)
2 weeks of symptoms COPD not exacerbated---working on decreasing cigarettes Will get CXR to exclude pneumonia  CXR shows increased atelectasis in lingula but no clear pneumonia Will treat with z-pak If doesn't resolve, will change to levaquin

## 2023-01-12 NOTE — Progress Notes (Signed)
Subjective:    Patient ID: Patrick Chavez, male    DOB: 16-Jul-1956, 67 y.o.   MRN: [id number]  HPI Here due to persistent respiratory infection  Had norovirus first--bad GI illness Then it started with respiratory symptoms a couple symptoms Has cough---with pleuritic pain on left (with cough and big breath) No clear fever--but has felt tired and intermittently had to miss work Slight chill last week--no sweats  Had sore throat for a week--voice still off some Not really SOB Slight wheezing though No ear pain  Headache the first week--frontal Some sputum  Current Outpatient Medications on File Prior to Visit  Medication Sig Dispense Refill   amLODipine (NORVASC) 5 MG tablet Take 1 tablet (5 mg total) by mouth daily. 90 tablet 1   aspirin 81 MG tablet Take 1 tablet (81 mg total) by mouth daily. 30 tablet 1   Multiple Vitamin (MULTIVITAMIN WITH MINERALS) TABS Take 1 tablet by mouth daily.     rosuvastatin (CRESTOR) 5 MG tablet Take 1 tablet (5 mg total) by mouth daily. 90 tablet 3   sildenafil (VIAGRA) 100 MG tablet Take 1 tablet (100 mg total) by mouth daily as needed for erectile dysfunction. 10 tablet 11   tamsulosin (FLOMAX) 0.4 MG CAPS capsule TAKE 1 CAPSULE BY MOUTH DAILY 90 capsule 0   No current facility-administered medications on file prior to visit.    No Known Allergies  Past Medical History:  Diagnosis Date   BPH with obstruction/lower urinary tract symptoms    Hepatitis C last year   was treated   Hypertension     Past Surgical History:  Procedure Laterality Date   CATARACT EXTRACTION W/ INTRAOCULAR LENS IMPLANT Bilateral    TONSILLECTOMY      Family History  Problem Relation Age of Onset   Hypertension Mother    Diabetes Mother    Cancer Mother    Glaucoma Mother    Pancreatic cancer Mother    Cancer Father    Throat cancer Father    Stomach cancer Father    Hypertension Other     Social History   Socioeconomic History   Marital status:  Married    Spouse name: Not on file   Number of children: 7   Years of education: Not on file   Highest education level: Not on file  Occupational History   Occupation: Warehouse work    Comment: retired   Occupation: Special needs supervision--adults    Comment: Part time  Tobacco Use   Smoking status: Every Day    Packs/day: 1.00    Types: Cigarettes    Passive exposure: Past   Smokeless tobacco: Never  Substance and Sexual Activity   Alcohol use: Yes    Comment: Rare   Drug use: No   Sexual activity: Not on file  Other Topics Concern   Not on file  Social History Narrative   3 children of his own   4 step children      No living will   Wife would be health care POA--then kids   Would accept resuscitaion   Social Determinants of Health   Financial Resource Strain: Not on file  Food Insecurity: Not on file  Transportation Needs: Not on file  Physical Activity: Not on file  Stress: Not on file  Social Connections: Not on file  Intimate Partner Violence: Not on file   Review of Systems Some decrease in smell and taste last week Appetite is off last week--but better now  COVID test negative x 2 No N/V     Objective:   Physical Exam Constitutional:      Appearance: Normal appearance.  HENT:     Head:     Comments: No sinus tenderness    Right Ear: Tympanic membrane and ear canal normal.     Left Ear: Tympanic membrane and ear canal normal.     Mouth/Throat:     Pharynx: No oropharyngeal exudate or posterior oropharyngeal erythema.  Pulmonary:     Effort: Pulmonary effort is normal.     Breath sounds: No wheezing or rales.     Comments: Decreased breath sounds but clear Neurological:     Mental Status: He is alert.            Assessment & Plan:

## 2023-01-26 DIAGNOSIS — H401112 Primary open-angle glaucoma, right eye, moderate stage: Secondary | ICD-10-CM | POA: Diagnosis not present

## 2023-02-08 DIAGNOSIS — B349 Viral infection, unspecified: Secondary | ICD-10-CM | POA: Diagnosis not present

## 2023-02-10 ENCOUNTER — Ambulatory Visit
Admission: EM | Admit: 2023-02-10 | Discharge: 2023-02-10 | Disposition: A | Discharge status: Home / Self Care | Payer: Medicare Other | Attending: Family Medicine | Admitting: Family Medicine

## 2023-02-10 ENCOUNTER — Ambulatory Visit: Payer: Medicare Other | Admitting: Internal Medicine

## 2023-02-10 DIAGNOSIS — J019 Acute sinusitis, unspecified: Secondary | ICD-10-CM

## 2023-02-10 MED ORDER — CEFDINIR 300 MG PO CAPS: 600.0000 mg | ORAL_CAPSULE | Freq: Every day | ORAL | 0 refills | Status: AC

## 2023-02-10 MED ORDER — METHYLPREDNISOLONE ACETATE 80 MG/ML IJ SUSP
80.0000 mg | Freq: Once | INTRAMUSCULAR | Status: AC
  Administered 2023-02-10: 80 mg via INTRAMUSCULAR

## 2023-02-10 MED ORDER — IBUPROFEN 800 MG PO TABS: 800.0000 mg | ORAL_TABLET | Freq: Three times a day (TID) | ORAL | 0 refills | Status: DC | PRN

## 2023-02-10 NOTE — ED Provider Notes (Signed)
[facility]    CSN: [id number] Arrival date & time: 02/10/23  [id number]      History   Chief Complaint Chief Complaint  Patient presents with   Shortness of Breath   Generalized Body Aches    HPI Patrick Chavez is a 67 y.o. male.    Shortness of Breath  Here for nasal congestion and cough that began 7 to 8 days ago.  He has not had any fever.  He was seen by his primary care clinic about 2 days ago and was prescribed Phenergan with dextromethorphan and Atrovent nasal spray.  He states that the nasal spray seems to have dried out his nose a lot and his mouth is very dry.  He has not had any shortness of breath and that he has not had any trouble breathing in his chest.  He states his nose is congested and that is making it hard to breathe through his nose.  He now has pressure in his cheeks bilaterally and in his frontal area.  And he is having some headache.  He has a history of hypertension, blood pressure here today is good.  No ear pain   He has not had any wheezing with this illness  Past Medical History:  Diagnosis Date   BPH with obstruction/lower urinary tract symptoms    Hepatitis C last year   was treated   Hypertension     Patient Active Problem List   Diagnosis Date Noted   Pleuritic chest pain 01/12/2023   Emphysema lung ([place]) 12/06/2022   Aortic atherosclerosis ([place]) 12/06/2022   BPH with obstruction/lower urinary tract symptoms 08/20/2022   Hypertension 08/20/2022   Smoker 08/20/2022   Osteoarthritis of right hip 08/20/2022    Past Surgical History:  Procedure Laterality Date   CATARACT EXTRACTION W/ INTRAOCULAR LENS IMPLANT Bilateral    TONSILLECTOMY         Home Medications    Prior to Admission medications   Medication Sig Start Date End Date Taking? Authorizing Provider  cefdinir (OMNICEF) 300 MG capsule Take 2 capsules (600 mg total) by mouth daily for 7 days. 02/10/23 02/17/23 Yes Barrett Henle, MD  ibuprofen (ADVIL) 800 MG  tablet Take 1 tablet (800 mg total) by mouth every 8 (eight) hours as needed (pain). 02/10/23  Yes Barrett Henle, MD  amLODipine (NORVASC) 5 MG tablet Take 1 tablet (5 mg total) by mouth daily. 01/12/23   Venia Carbon, MD  aspirin 81 MG tablet Take 1 tablet (81 mg total) by mouth daily. 09/16/15   Reyne Dumas, MD  Multiple Vitamin (MULTIVITAMIN WITH MINERALS) TABS Take 1 tablet by mouth daily.    [provider]  rosuvastatin (CRESTOR) 5 MG tablet Take 1 tablet (5 mg total) by mouth daily. 12/06/22   Venia Carbon, MD  sildenafil (VIAGRA) 100 MG tablet Take 1 tablet (100 mg total) by mouth daily as needed for erectile dysfunction. 08/20/22   Venia Carbon, MD  tamsulosin (FLOMAX) 0.4 MG CAPS capsule TAKE 1 CAPSULE BY MOUTH DAILY 12/09/22   Venia Carbon, MD    Family History Family History  Problem Relation Age of Onset   Hypertension Mother    Diabetes Mother    Cancer Mother    Glaucoma Mother    Pancreatic cancer Mother    Cancer Father    Throat cancer Father    Stomach cancer Father    Hypertension Other     Social History Social History  Tobacco Use   Smoking status: Every Day    Packs/day: 1    Types: Cigarettes    Passive exposure: Past   Smokeless tobacco: Never  Substance Use Topics   Alcohol use: Yes    Comment: Rare   Drug use: No     Allergies   Patient has no known allergies.   Review of Systems Review of Systems  Respiratory:  Positive for shortness of breath.      Physical Exam Triage Vital Signs ED Triage Vitals [02/10/23 0907]  Enc Vitals Group     BP 138/83     Pulse Rate 100     Resp 17     Temp 98.2 F (36.8 C)     Temp Source Oral     SpO2 96 %     Weight      Height      Head Circumference      Peak Flow      Pain Score 3     Pain Loc      Pain Edu?      Excl. in [place]?    No data found.  Updated Vital Signs BP 138/83 (BP Location: Right Arm)   Pulse 100   Temp 98.2 F (36.8 C) (Oral)   Resp 17    SpO2 96%   Visual Acuity Right Eye Distance:   Left Eye Distance:   Bilateral Distance:    Right Eye Near:   Left Eye Near:    Bilateral Near:     Physical Exam Vitals reviewed.  Constitutional:      General: He is not in acute distress.    Appearance: He is not toxic-appearing.  HENT:     Nose: Nose normal.     Mouth/Throat:     Mouth: Mucous membranes are moist.     Pharynx: No oropharyngeal exudate or posterior oropharyngeal erythema.  Eyes:     Extraocular Movements: Extraocular movements intact.     Conjunctiva/sclera: Conjunctivae normal.     Pupils: Pupils are equal, round, and reactive to light.  Cardiovascular:     Rate and Rhythm: Normal rate and regular rhythm.     Heart sounds: No murmur heard. Pulmonary:     Effort: No respiratory distress.     Breath sounds: No stridor. No wheezing, rhonchi or rales.  Musculoskeletal:     Cervical back: Neck supple.  Lymphadenopathy:     Cervical: No cervical adenopathy.  Skin:    Capillary Refill: Capillary refill takes less than 2 seconds.     Coloration: Skin is not jaundiced or pale.  Neurological:     General: No focal deficit present.     Mental Status: He is alert and oriented to person, place, and time.  Psychiatric:        Behavior: Behavior normal.      UC Treatments / Results  Labs (all labs ordered are listed, but only abnormal results are displayed) Labs Reviewed - No data to display  EKG   Radiology No results found.  Procedures Procedures (including critical care time)  Medications Ordered in UC Medications  methylPREDNISolone acetate (DEPO-MEDROL) injection 80 mg (has no administration in time range)    Initial Impression / Assessment and Plan / UC Course  I have reviewed the triage vital signs and the nursing notes.  Pertinent labs & imaging results that were available during my care of the patient were reviewed by me and considered in my medical decision making (see chart  for  details).        Since he has been sick over a week and now has sinus pressure I am going to treat for acute sinusitis.  I did discuss with him that most likely the origin of this illness was initially a virus.  Depo-Medrol is given for sinus inflammation, and Omnicef is sent in to treat the sinusitis.  Work note given is for the next 2 days that he is due to work. Final Clinical Impressions(s) / UC Diagnoses   Final diagnoses:  Acute sinusitis, recurrence not specified, unspecified location     Discharge Instructions      You have been given a shot of methylprednisolone 80 mg today.  This is a steroid type medicine for inflammation, intended to help with inflammation in her sinus area.  Take cefdinir 300 mg--2 capsules together daily for 7 days  Take ibuprofen 800 mg--1 tab every 8 hours as needed for pain.  Stop using the ipratropium nasal spray  Use saline nose spray to rinse out your nose 2 or 3 times a day.     ED Prescriptions     Medication Sig Dispense Auth. Provider   cefdinir (OMNICEF) 300 MG capsule Take 2 capsules (600 mg total) by mouth daily for 7 days. 14 capsule Barrett Henle, MD   ibuprofen (ADVIL) 800 MG tablet Take 1 tablet (800 mg total) by mouth every 8 (eight) hours as needed (pain). 21 tablet Yohannes Waibel, Gwenlyn Perking, MD      PDMP not reviewed this encounter.   Barrett Henle, MD 02/10/23 [phone]

## 2023-02-10 NOTE — Discharge Instructions (Signed)
You have been given a shot of methylprednisolone 80 mg today.  This is a steroid type medicine for inflammation, intended to help with inflammation in her sinus area.  Take cefdinir 300 mg--2 capsules together daily for 7 days  Take ibuprofen 800 mg--1 tab every 8 hours as needed for pain.  Stop using the ipratropium nasal spray  Use saline nose spray to rinse out your nose 2 or 3 times a day.

## 2023-02-10 NOTE — ED Triage Notes (Signed)
Pt presents with c/o nasal congestion, sob, generalized body aches x 1 week. States he has taken OTC he has not had any relief. Pt states he took a covid test at home and reports it was negative.

## 2023-02-21 ENCOUNTER — Encounter: Payer: Self-pay | Admitting: Internal Medicine

## 2023-02-21 ENCOUNTER — Ambulatory Visit (INDEPENDENT_AMBULATORY_CARE_PROVIDER_SITE_OTHER): Payer: Medicare Other | Admitting: Internal Medicine

## 2023-02-21 VITALS — BP 134/82 | HR 79 | Temp 97.6°F | Ht 75.0 in | Wt 201.0 lb

## 2023-02-21 DIAGNOSIS — N138 Other obstructive and reflux uropathy: Secondary | ICD-10-CM | POA: Diagnosis not present

## 2023-02-21 DIAGNOSIS — N401 Enlarged prostate with lower urinary tract symptoms: Secondary | ICD-10-CM | POA: Diagnosis not present

## 2023-02-21 DIAGNOSIS — I700 Atherosclerosis of aorta: Secondary | ICD-10-CM | POA: Diagnosis not present

## 2023-02-21 DIAGNOSIS — I10 Essential (primary) hypertension: Secondary | ICD-10-CM | POA: Diagnosis not present

## 2023-02-21 DIAGNOSIS — F17200 Nicotine dependence, unspecified, uncomplicated: Secondary | ICD-10-CM

## 2023-02-21 DIAGNOSIS — J439 Emphysema, unspecified: Secondary | ICD-10-CM | POA: Diagnosis not present

## 2023-02-21 DIAGNOSIS — Z0000 Encounter for general adult medical examination without abnormal findings: Secondary | ICD-10-CM | POA: Diagnosis not present

## 2023-02-21 LAB — LIPID PANEL
Cholesterol: 145 mg/dL (ref 0–200)
HDL: 36.7 mg/dL — ABNORMAL LOW (ref >39.00)
LDL Cholesterol: 89 mg/dL (ref 0–99)
NonHDL: 108.27
Total CHOL/HDL Ratio: 4
Triglycerides: 96 mg/dL (ref 0.0–149.0)
VLDL: 19.2 mg/dL (ref 0.0–40.0)

## 2023-02-21 LAB — CBC
HCT: 39.6 % (ref 39.0–52.0)
Hemoglobin: 13.3 g/dL (ref 13.0–17.0)
MCHC: 33.5 g/dL (ref 30.0–36.0)
MCV: 92.8 fl (ref 78.0–100.0)
Platelets: 352 10*3/uL (ref 150.0–400.0)
RBC: 4.27 Mil/uL (ref 4.22–5.81)
RDW: 14.2 % (ref 11.5–15.5)
WBC: 6.6 10*3/uL (ref 4.0–10.5)

## 2023-02-21 LAB — COMPREHENSIVE METABOLIC PANEL
ALT: 16 U/L (ref 0–53)
AST: 19 U/L (ref 0–37)
Albumin: 4.3 g/dL (ref 3.5–5.2)
Alkaline Phosphatase: 59 U/L (ref 39–117)
BUN: 14 mg/dL (ref 6–23)
CO2: 25 mEq/L (ref 19–32)
Calcium: 9.4 mg/dL (ref 8.4–10.5)
Chloride: 108 mEq/L (ref 96–112)
Creatinine, Ser: 0.98 mg/dL (ref 0.40–1.50)
GFR: 80.57 mL/min (ref >60.00)
Glucose, Bld: 84 mg/dL (ref 70–99)
Potassium: 3.8 mEq/L (ref 3.5–5.1)
Sodium: 140 mEq/L (ref 135–145)
Total Bilirubin: 0.7 mg/dL (ref 0.2–1.2)
Total Protein: 7.5 g/dL (ref 6.0–8.3)

## 2023-02-21 MED ORDER — ATORVASTATIN CALCIUM 10 MG PO TABS: 10.0000 mg | ORAL_TABLET | Freq: Every day | ORAL | 3 refills | Status: DC

## 2023-02-21 NOTE — Progress Notes (Signed)
Hearing Screening   500Hz  1000Hz  2000Hz  4000Hz   Right ear 20 20 20 20   Left ear 20 20 20  40   Vision Screening   Right eye Left eye Both eyes  Without correction     With correction 20/25 20/20 20/13

## 2023-02-21 NOTE — Assessment & Plan Note (Signed)
On CT scan Didn't tolerate rosuvastatin--will try atorvastatin 10 daily

## 2023-02-21 NOTE — Progress Notes (Signed)
Subjective:    Patient ID: Patrick Chavez, male    DOB: 06/25/1956, 67 y.o.   MRN: [id number]  HPI Here for initial Medicare wellness visit and follow up of chronic health conditions Reviewed advanced directives Reviewed other doctors---Dr Shaw--ophthal,  No hospitalizations or surgery the past year Vision is okay now--since cataract out Hearing is okay Rare alcohol Not exercising much--plans to increase No falls No depression or anhedonia Independent with instrumental ADLs---still works two jobs No sig memory issues  Interested in trying patches to stop smoking Back to 1/2-1 pack per day Only occasional cough--did have recent sinus infection (went to urgent care--got inhaler and cough med)--then went to another urgent care ([facility]--depomedrol and omnicef) Does get some DOE at work at times  No chest pain Did get palpitations (fluttering) with one of the cold meds No dizziness or syncope No edema No regular headaches  Stopped the rosuvastatin--it made him lightheaded  No sig joint issues Right hip/pelvis is stiff when first getting up No meds  Voids okay--some frequency Stream is okay Only occasional nocturia Satisfied with sildenafil for the most part--not always helpful  Current Outpatient Medications on File Prior to Visit  Medication Sig Dispense Refill   amLODipine (NORVASC) 5 MG tablet Take 1 tablet (5 mg total) by mouth daily. 90 tablet 3   aspirin 81 MG tablet Take 1 tablet (81 mg total) by mouth daily. 30 tablet 1   ibuprofen (ADVIL) 800 MG tablet Take 1 tablet (800 mg total) by mouth every 8 (eight) hours as needed (pain). 21 tablet 0   Multiple Vitamin (MULTIVITAMIN WITH MINERALS) TABS Take 1 tablet by mouth daily.     sildenafil (VIAGRA) 100 MG tablet Take 1 tablet (100 mg total) by mouth daily as needed for erectile dysfunction. 10 tablet 11   tamsulosin (FLOMAX) 0.4 MG CAPS capsule TAKE 1 CAPSULE BY MOUTH DAILY 90 capsule 0   No current  facility-administered medications on file prior to visit.    No Known Allergies  Past Medical History:  Diagnosis Date   BPH with obstruction/lower urinary tract symptoms    Hepatitis C last year   was treated   Hypertension     Past Surgical History:  Procedure Laterality Date   CATARACT EXTRACTION W/ INTRAOCULAR LENS IMPLANT Bilateral    TONSILLECTOMY      Family History  Problem Relation Age of Onset   Hypertension Mother    Diabetes Mother    Cancer Mother    Glaucoma Mother    Pancreatic cancer Mother    Cancer Father    Throat cancer Father    Stomach cancer Father    Hypertension Other     Social History   Socioeconomic History   Marital status: Married    Spouse name: Not on file   Number of children: 7   Years of education: Not on file   Highest education level: Not on file  Occupational History   Occupation: Warehouse work    Comment: retired   Occupation: Special needs supervision--adults    Comment: full time   Occupation: Journalist, newspaper: [employer]  Tobacco Use   Smoking status: Every Day    Packs/day: 1    Types: Cigarettes    Passive exposure: Past   Smokeless tobacco: Never  Substance and Sexual Activity   Alcohol use: Yes    Comment: Rare   Drug use: No   Sexual activity: Not on file  Other Topics Concern  Not on file  Social History Narrative   3 children of his own   4 step children      No living will   Wife would be health care POA--then kids   Would accept resuscitaion   Not sure about tube feeds---not if cognitively unaware   Social Determinants of Health   Financial Resource Strain: Not on file  Food Insecurity: Not on file  Transportation Needs: Not on file  Physical Activity: Not on file  Stress: Not on file  Social Connections: Not on file  Intimate Partner Violence: Not on file   Review of Systems Appetite is good Weight is stable Sleeps okay Wears seat belt Has upper plate, 7 on bottom.  Needs dentist No suspicious skin lesions No heartburn or dysphagia Bowels move fine--no blood     Objective:   Physical Exam Constitutional:      Appearance: Normal appearance.  HENT:     Mouth/Throat:     Pharynx: No oropharyngeal exudate or posterior oropharyngeal erythema.  Eyes:     Conjunctiva/sclera: Conjunctivae normal.     Pupils: Pupils are equal, round, and reactive to light.  Cardiovascular:     Rate and Rhythm: Normal rate and regular rhythm.     Pulses: Normal pulses.     Heart sounds:     No gallop.  Pulmonary:     Effort: Pulmonary effort is normal.     Breath sounds: Normal breath sounds. No wheezing or rales.  Abdominal:     Palpations: Abdomen is soft.     Tenderness: There is no abdominal tenderness.  Musculoskeletal:     Cervical back: Neck supple.     Right lower leg: No edema.     Left lower leg: No edema.  Lymphadenopathy:     Cervical: No cervical adenopathy.  Skin:    Findings: No lesion or rash.  Neurological:     General: No focal deficit present.     Mental Status: He is alert and oriented to person, place, and time.     Comments: Word naming 8/1 minute Recall 3/3  Psychiatric:        Mood and Affect: Mood normal.        Behavior: Behavior normal.            Assessment & Plan:

## 2023-02-21 NOTE — Assessment & Plan Note (Signed)
I have personally reviewed the Medicare Annual Wellness questionnaire and have noted 1. The patient's medical and social history 2. Their use of alcohol, tobacco or illicit drugs 3. Their current medications and supplements 4. The patient's functional ability including ADL's, fall risks, home safety risks and hearing or visual             impairment. 5. Diet and physical activities 6. Evidence for depression or mood disorders  The patients weight, height, BMI and visual acuity have been recorded in the chart I have made referrals, counseling and provided education to the patient based review of the above and I have provided the pt with a written personalized care plan for preventive services.  I have provided you with a copy of your personalized plan for preventive services. Please take the time to review along with your updated medication list.  Colonoscopy due in 2 years PSA next year--done 8 months ago Discussed exercise Recommended COVID update, flu and RSV in the fall---he will consider (vaccines)

## 2023-02-21 NOTE — Assessment & Plan Note (Signed)
Discussed patches/lozenges for breakthrough cravings

## 2023-02-21 NOTE — Assessment & Plan Note (Signed)
BP Readings from Last 3 Encounters:  02/21/23 134/82  02/10/23 138/83  01/12/23 138/84   Controlled with the amlodipine 5mg  daily

## 2023-02-21 NOTE — Assessment & Plan Note (Signed)
Okay with the tamsulosin daily

## 2023-02-21 NOTE — Assessment & Plan Note (Signed)
Mild symptoms--cough/DOE Not enough for meds Will work on cigarette cessation

## 2023-02-21 NOTE — Progress Notes (Signed)
Hearing Screening - Comments:: Passed whisper test Vision Screening - Comments:: March 2024  

## 2023-03-12 ENCOUNTER — Other Ambulatory Visit: Payer: Self-pay | Admitting: Internal Medicine

## 2023-05-27 DIAGNOSIS — L600 Ingrowing nail: Secondary | ICD-10-CM | POA: Diagnosis not present

## 2023-05-27 DIAGNOSIS — M21612 Bunion of left foot: Secondary | ICD-10-CM | POA: Diagnosis not present

## 2023-06-22 DIAGNOSIS — L03031 Cellulitis of right toe: Secondary | ICD-10-CM | POA: Diagnosis not present

## 2023-07-18 ENCOUNTER — Ambulatory Visit
Admission: EM | Admit: 2023-07-18 | Discharge: 2023-07-18 | Disposition: A | Discharge status: Home / Self Care | Payer: Medicare Other | Attending: Internal Medicine | Admitting: Internal Medicine

## 2023-07-18 ENCOUNTER — Ambulatory Visit (INDEPENDENT_AMBULATORY_CARE_PROVIDER_SITE_OTHER): Payer: Medicare Other

## 2023-07-18 DIAGNOSIS — L03116 Cellulitis of left lower limb: Secondary | ICD-10-CM

## 2023-07-18 DIAGNOSIS — M25572 Pain in left ankle and joints of left foot: Secondary | ICD-10-CM

## 2023-07-18 DIAGNOSIS — M7989 Other specified soft tissue disorders: Secondary | ICD-10-CM | POA: Diagnosis not present

## 2023-07-18 MED ORDER — CEPHALEXIN 500 MG PO CAPS: 500.0000 mg | ORAL_CAPSULE | Freq: Four times a day (QID) | ORAL | 0 refills | Status: AC

## 2023-07-18 NOTE — ED Triage Notes (Signed)
Pt presents to UC w/ c/o right ankle swelling and pain x4 days. Pt presents with a limp. Pt denies falling or direct injury.

## 2023-07-18 NOTE — Discharge Instructions (Addendum)
Start Keflex 4 times a day for 7 days.  Please monitor your symptoms and if there is no improvement and/or it is worsening in the next 24 hours please go to the emergency room for further evaluation and workup of your symptoms.  Please follow-up with your PCP in 2 days for recheck.  I hope you feel better soon!

## 2023-07-18 NOTE — ED Provider Notes (Signed)
[facility]    CSN: [id number] Arrival date & time: 07/18/23  0947      History   Chief Complaint No chief complaint on file.   HPI Patrick Chavez is a 67 y.o. male presents for left ankle pain.  Patient reports 3 to 4 days of medial left ankle pain with swelling and this morning noticed some redness.  Denies any known injury or inciting event.  Swelling does improve with ice.  No numbness or tingling.  Does report pain radiates down to the sole of his foot.  No history of diabetes or PVD/PAD.  No chest pain or shortness of breath.  He is an active smoker.  No other concerns at this time.  HPI  Past Medical History:  Diagnosis Date   BPH with obstruction/lower urinary tract symptoms    Hepatitis C last year   was treated   Hypertension     Patient Active Problem List   Diagnosis Date Noted   Preventative health care 02/21/2023   Pleuritic chest pain 01/12/2023   Emphysema lung (HCC) 12/06/2022   Aortic atherosclerosis (HCC) 12/06/2022   BPH with obstruction/lower urinary tract symptoms 08/20/2022   Hypertension 08/20/2022   Smoker 08/20/2022   Osteoarthritis of right hip 08/20/2022    Past Surgical History:  Procedure Laterality Date   CATARACT EXTRACTION W/ INTRAOCULAR LENS IMPLANT Bilateral    TONSILLECTOMY         Home Medications    Prior to Admission medications   Medication Sig Start Date End Date Taking? Authorizing Provider  cephALEXin (KEFLEX) 500 MG capsule Take 1 capsule (500 mg total) by mouth 4 (four) times daily for 7 days. 07/18/23 07/25/23 Yes Radford Pax, NP  amLODipine (NORVASC) 5 MG tablet Take 1 tablet (5 mg total) by mouth daily. 01/12/23   Karie Schwalbe, MD  aspirin 81 MG tablet Take 1 tablet (81 mg total) by mouth daily. 09/16/15   Richarda Overlie, MD  atorvastatin (LIPITOR) 10 MG tablet Take 1 tablet (10 mg total) by mouth daily. 02/21/23   Karie Schwalbe, MD  ibuprofen (ADVIL) 800 MG tablet Take 1 tablet (800 mg total) by mouth  every 8 (eight) hours as needed (pain). 02/10/23   Zenia Resides, MD  Multiple Vitamin (MULTIVITAMIN WITH MINERALS) TABS Take 1 tablet by mouth daily.    [provider]  sildenafil (VIAGRA) 100 MG tablet Take 1 tablet (100 mg total) by mouth daily as needed for erectile dysfunction. 08/20/22   Karie Schwalbe, MD  tamsulosin (FLOMAX) 0.4 MG CAPS capsule TAKE 1 CAPSULE BY MOUTH DAILY 03/14/23   Karie Schwalbe, MD    Family History Family History  Problem Relation Age of Onset   Hypertension Mother    Diabetes Mother    Cancer Mother    Glaucoma Mother    Pancreatic cancer Mother    Cancer Father    Throat cancer Father    Stomach cancer Father    Hypertension Other     Social History Social History   Tobacco Use   Smoking status: Every Day    Current packs/day: 1.00    Types: Cigarettes    Passive exposure: Past   Smokeless tobacco: Never  Substance Use Topics   Alcohol use: Yes    Comment: Rare   Drug use: No     Allergies   Patient has no known allergies.   Review of Systems Review of Systems  Musculoskeletal:  Left ankle pain      Physical Exam Triage Vital Signs ED Triage Vitals  Encounter Vitals Group     BP 07/18/23 1000 (!) 152/85     Systolic BP Percentile --      Diastolic BP Percentile --      Pulse Rate 07/18/23 1000 78     Resp 07/18/23 1000 16     Temp 07/18/23 1000 98.1 F (36.7 C)     Temp Source 07/18/23 1000 Oral     SpO2 07/18/23 1000 97 %     Weight --      Height --      Head Circumference --      Peak Flow --      Pain Score 07/18/23 1004 6     Pain Loc --      Pain Education --      Exclude from Growth Chart --    No data found.  Updated Vital Signs BP (!) 152/85 (BP Location: Left Arm)   Pulse 78   Temp 98.1 F (36.7 C) (Oral)   Resp 16   SpO2 97%   Visual Acuity Right Eye Distance:   Left Eye Distance:   Bilateral Distance:    Right Eye Near:   Left Eye Near:    Bilateral Near:      Physical Exam Vitals and nursing note reviewed.  Constitutional:      General: He is not in acute distress.    Appearance: Normal appearance. He is not ill-appearing.  HENT:     Head: Normocephalic and atraumatic.  Eyes:     Pupils: Pupils are equal, round, and reactive to light.  Cardiovascular:     Rate and Rhythm: Normal rate.  Pulmonary:     Effort: Pulmonary effort is normal.  Musculoskeletal:       Feet:  Feet:     Comments: Mild/moderate swelling of the left medial malleolus with mild erythema and warmth adjacent to the malleolus.  Tender to palpation to the site as well as to posterior ankle, sole of heel and to midfoot.  DP +2.  Small scabbed lesion to medial ankle.  Skin:    General: Skin is warm and dry.  Neurological:     General: No focal deficit present.     Mental Status: He is alert and oriented to person, place, and time.  Psychiatric:        Mood and Affect: Mood normal.        Behavior: Behavior normal.      UC Treatments / Results  Labs (all labs ordered are listed, but only abnormal results are displayed) Labs Reviewed - No data to display  Comprehensive metabolic panel Order: [id number] Status: Final result     Visible to patient: Yes (seen)     Next appt: 02/22/2024 at 09:30 AM in Family Medicine Tillman Abide, MD)     Dx: Primary hypertension   0 Result Notes     1 Patient Communication          Component Ref Range & Units 4 mo ago (02/21/23) 7 yr ago (09/16/15) 7 yr ago (09/15/15) 7 yr ago (09/15/15) 7 yr ago (09/15/15) 11 yr ago (04/26/12) 14 yr ago (06/28/09)  Sodium 135 - 145 mEq/L 140 140 R  139 R 142 R 136 138  Potassium 3.5 - 5.1 mEq/L 3.8 3.9 R  3.9 R 3.9 R 3.7 4.1  Chloride 96 - 112 mEq/L 108 104 R  103 R  103 R 101 108  CO2 19 - 32 mEq/L 25 27 R  27 R  23   Glucose, Bld 70 - 99 mg/dL 84 96 R  308 High  R 657 High  R 179 High  84  BUN 6 - 23 mg/dL 14 11 R  11 R 14 R 11 7  Creatinine, Ser 0.40 - 1.50 mg/dL 8.46 9.62  R 9.52 R 8.41 R 1.20 R 0.94 R 1.0 R  Total Bilirubin 0.2 - 1.2 mg/dL 0.7 0.8 R  0.7 R     Alkaline Phosphatase 39 - 117 U/L 59 53 R  58 R     AST 0 - 37 U/L 19 24 R  27 R     ALT 0 - 53 U/L 16 22 R  24 R     Total Protein 6.0 - 8.3 g/dL 7.5 7.0 R  7.7 R     Albumin 3.5 - 5.2 g/dL 4.3 3.7 R  4.0 R     GFR >60.00 mL/min 80.57        Comment: Calculated using the CKD-EPI Creatinine Equation (2021)  Calcium 8.4 - 10.5 mg/dL 9.4 9.6 R  9.9 R  9.3   Resulting Agency [facility]         Specimen Collected: 02/21/23 09:40 Last Resulted: 02/21/23 14:58      EKG   Radiology DG Ankle Complete Left  Result Date: 07/18/2023 CLINICAL DATA:  Medial left ankle pain and swelling. No known injury. EXAM: LEFT ANKLE COMPLETE - 3+ VIEW COMPARISON:  None Available. FINDINGS: There is no evidence of fracture, dislocation, or joint effusion. There is no evidence of arthropathy or other focal bone abnormality. Soft tissues are unremarkable. IMPRESSION: Negative. Electronically Signed   By: Danae Orleans M.D.   On: 07/18/2023 10:48    Procedures Procedures (including critical care time)  Medications Ordered in UC Medications - No data to display  Initial Impression / Assessment and Plan / UC Course  I have reviewed the triage vital signs and the nursing notes.  Pertinent labs & imaging results that were available during my care of the patient were reviewed by me and considered in my medical decision making (see chart for details).     Reviewed exam and symptoms with patient.  X-ray negative.  Discussed differentials including cellulitis/superficial clot/DVT.  Discussed with patient.  Advised I cannot rule out DVT in the setting.  Gets ER evaluation today.  He prefers to treat for cellulitis and have close monitoring.  Given scabbed lesion will treat for cellulitis with Keflex.  Patient was instructed if there is no  improvement and/or it worsens within 24 hours he is to go to the ER for further workup and he verbalized understanding of this.  PCP follow-up 2 days for recheck. Final Clinical Impressions(s) / UC Diagnoses   Final diagnoses:  Acute left ankle pain  Cellulitis of left ankle     Discharge Instructions      Start Keflex 4 times a day for 7 days.  Please monitor your symptoms and if there is no improvement and/or it is worsening in the next 24 hours please go to the emergency room for further evaluation and workup of your symptoms.  Please follow-up with your PCP in 2 days for recheck.  I hope you feel better soon!     ED Prescriptions  Medication Sig Dispense Auth. Provider   cephALEXin (KEFLEX) 500 MG capsule Take 1 capsule (500 mg total) by mouth 4 (four) times daily for 7 days. 28 capsule Radford Pax, NP      PDMP not reviewed this encounter.   Radford Pax, NP 07/18/23 [phone]

## 2023-07-19 DIAGNOSIS — M21612 Bunion of left foot: Secondary | ICD-10-CM | POA: Diagnosis not present

## 2023-07-19 DIAGNOSIS — M7662 Achilles tendinitis, left leg: Secondary | ICD-10-CM | POA: Diagnosis not present

## 2023-07-19 DIAGNOSIS — M65872 Other synovitis and tenosynovitis, left ankle and foot: Secondary | ICD-10-CM | POA: Diagnosis not present

## 2023-07-19 DIAGNOSIS — M10079 Idiopathic gout, unspecified ankle and foot: Secondary | ICD-10-CM | POA: Diagnosis not present

## 2023-07-19 DIAGNOSIS — Z79899 Other long term (current) drug therapy: Secondary | ICD-10-CM | POA: Diagnosis not present

## 2023-08-01 DIAGNOSIS — H401131 Primary open-angle glaucoma, bilateral, mild stage: Secondary | ICD-10-CM | POA: Diagnosis not present

## 2023-08-02 DIAGNOSIS — L600 Ingrowing nail: Secondary | ICD-10-CM | POA: Diagnosis not present

## 2023-08-02 DIAGNOSIS — M79671 Pain in right foot: Secondary | ICD-10-CM | POA: Diagnosis not present

## 2023-08-02 DIAGNOSIS — B351 Tinea unguium: Secondary | ICD-10-CM | POA: Diagnosis not present

## 2023-08-02 DIAGNOSIS — M7752 Other enthesopathy of left foot: Secondary | ICD-10-CM | POA: Diagnosis not present

## 2023-08-04 ENCOUNTER — Ambulatory Visit
Admission: EM | Admit: 2023-08-04 | Discharge: 2023-08-04 | Disposition: A | Discharge status: Home / Self Care | Payer: Medicare Other | Attending: Internal Medicine | Admitting: Internal Medicine

## 2023-08-04 DIAGNOSIS — B349 Viral infection, unspecified: Secondary | ICD-10-CM | POA: Diagnosis not present

## 2023-08-04 DIAGNOSIS — F17200 Nicotine dependence, unspecified, uncomplicated: Secondary | ICD-10-CM

## 2023-08-04 DIAGNOSIS — J439 Emphysema, unspecified: Secondary | ICD-10-CM

## 2023-08-04 DIAGNOSIS — U071 COVID-19: Secondary | ICD-10-CM | POA: Diagnosis not present

## 2023-08-04 MED ORDER — ALBUTEROL SULFATE HFA 108 (90 BASE) MCG/ACT IN AERS: 1.0000 | INHALATION_SPRAY | Freq: Four times a day (QID) | RESPIRATORY_TRACT | 0 refills | Status: DC | PRN

## 2023-08-04 MED ORDER — PROMETHAZINE-DM 6.25-15 MG/5ML PO SYRP: 5.0000 mL | ORAL_SOLUTION | Freq: Three times a day (TID) | ORAL | 0 refills | Status: DC | PRN

## 2023-08-04 MED ORDER — PREDNISONE 20 MG PO TABS: ORAL_TABLET | ORAL | 0 refills | Status: DC

## 2023-08-04 MED ORDER — ACETAMINOPHEN 325 MG PO TABS: 650.0000 mg | ORAL_TABLET | Freq: Four times a day (QID) | ORAL | 0 refills | Status: DC | PRN

## 2023-08-04 NOTE — Discharge Instructions (Addendum)
We will notify you of your test results as they arrive and may take between about 24 hours.  I encourage you to sign up for MyChart if you have not already done so as this can be the easiest way for [place] to communicate results to you online or through a phone app.  Generally, we only contact you if it is a positive test result.  In the meantime, if you develop worsening symptoms including fever, chest pain, shortness of breath despite our current treatment plan then please report to the emergency room as this may be a sign of worsening status from possible viral infection.  Otherwise, we will manage this as a viral syndrome. For sore throat or cough try using a honey-based tea. Use 3 teaspoons of honey with juice squeezed from half lemon. Place shaved pieces of ginger into 1/2-1 cup of water and warm over stove top. Then mix the ingredients and repeat every 4 hours as needed. Please take Tylenol 650mg  every 6 hours for aches and pains, fevers. Hydrate very well with at least 2 liters of water. Eat light meals such as soups to replenish electrolytes and soft fruits, veggies. Start an antihistamine like Zyrtec (10mg  daily) for postnasal drainage, sinus congestion.  You can take this together with prednisone and albuterol inhaler.  Use the cough medications as needed.   If you test positive for COVID 19, we will prescribe Paxlovid, a COVID antiviral medication that can help prevent complications like hospitalization and severe illness. If you end up taking this medication, you have to stop your atorvastatin, cholesterol medication.

## 2023-08-04 NOTE — ED Provider Notes (Signed)
[facility]  Note:  This document was prepared using Conservation officer, historic buildings and may include unintentional dictation errors.  MRN: [id number] DOB: 1956-04-13  Subjective:   Patrick Chavez is a 67 y.o. male presenting for 4-day history of acute onset malaise, fatigue, shortness of breath, dry hacking cough, chills, sinus congestion, chest congestion, sneezing.  Has been using over-the-counter cold medication without relief.  Patient has a history of emphysema.  Smokes 1 pack/day.  Would like an albuterol inhaler.  He is okay with getting a COVID test.   No current facility-administered medications for this encounter.  Current Outpatient Medications:    amLODipine (NORVASC) 5 MG tablet, Take 1 tablet (5 mg total) by mouth daily., Disp: 90 tablet, Rfl: 3   aspirin 81 MG tablet, Take 1 tablet (81 mg total) by mouth daily., Disp: 30 tablet, Rfl: 1   atorvastatin (LIPITOR) 10 MG tablet, Take 1 tablet (10 mg total) by mouth daily., Disp: 90 tablet, Rfl: 3   ibuprofen (ADVIL) 800 MG tablet, Take 1 tablet (800 mg total) by mouth every 8 (eight) hours as needed (pain)., Disp: 21 tablet, Rfl: 0   Multiple Vitamin (MULTIVITAMIN WITH MINERALS) TABS, Take 1 tablet by mouth daily., Disp: , Rfl:    sildenafil (VIAGRA) 100 MG tablet, Take 1 tablet (100 mg total) by mouth daily as needed for erectile dysfunction., Disp: 10 tablet, Rfl: 11   tamsulosin (FLOMAX) 0.4 MG CAPS capsule, TAKE 1 CAPSULE BY MOUTH DAILY, Disp: 90 capsule, Rfl: 3   Allergies  Allergen Reactions   Tamsulosin Nausea Only    Past Medical History:  Diagnosis Date   BPH with obstruction/lower urinary tract symptoms    Hepatitis C last year   was treated   Hypertension      Past Surgical History:  Procedure Laterality Date   CATARACT EXTRACTION W/ INTRAOCULAR LENS IMPLANT Bilateral    TONSILLECTOMY      Family History  Problem Relation Age of Onset   Hypertension Mother    Diabetes Mother     Cancer Mother    Glaucoma Mother    Pancreatic cancer Mother    Cancer Father    Throat cancer Father    Stomach cancer Father    Hypertension Other     Social History   Tobacco Use   Smoking status: Every Day    Current packs/day: 1.00    Types: Cigarettes    Passive exposure: Past   Smokeless tobacco: Never  Substance Use Topics   Alcohol use: Yes    Comment: Rare   Drug use: No    ROS   Objective:   Vitals: BP 129/86 (BP Location: Right Arm)   Pulse (!) 103   Temp 99.2 F (37.3 C) (Oral)   Resp 17   SpO2 95%   Physical Exam Constitutional:      General: He is not in acute distress.    Appearance: Normal appearance. He is well-developed and normal weight. He is not ill-appearing, toxic-appearing or diaphoretic.  HENT:     Head: Normocephalic and atraumatic.     Right Ear: External ear normal.     Left Ear: External ear normal.     Nose: Nose normal.     Mouth/Throat:     Mouth: Mucous membranes are moist.  Eyes:     General: No scleral icterus.       Right eye: No discharge.        Left eye: No discharge.  Extraocular Movements: Extraocular movements intact.  Cardiovascular:     Rate and Rhythm: Normal rate and regular rhythm.     Heart sounds: Normal heart sounds. No murmur heard.    No friction rub. No gallop.  Pulmonary:     Effort: Pulmonary effort is normal. No respiratory distress.     Breath sounds: No stridor. No wheezing, rhonchi or rales.     Comments: Mild coarse lung sounds throughout. Musculoskeletal:     Cervical back: Normal range of motion.  Neurological:     Mental Status: He is alert and oriented to person, place, and time.  Psychiatric:        Mood and Affect: Mood normal.        Behavior: Behavior normal.        Thought Content: Thought content normal.        Judgment: Judgment normal.     Assessment and Plan :   PDMP not reviewed this encounter.  1. Acute viral syndrome   2. Smoker   3. Pulmonary emphysema,  unspecified emphysema type (HCC)    Deferred imaging for now.  Recommended a oral prednisone course and albuterol use given his respiratory symptoms, smoking and pulmonary emphysema.  As such, patient will be a good candidate to undergo Paxlovid treatment should he test positive for COVID-19.  He would have to hold atorvastatin if this is the case.  Otherwise use supportive care for an acute viral syndrome.  Testing is pending.  Counseled patient on potential for adverse effects with medications prescribed/recommended today, ER and return-to-clinic precautions discussed, patient verbalized understanding.    Wallis Bamberg, PA-C 08/04/23 1039

## 2023-08-04 NOTE — ED Triage Notes (Signed)
Pt presents with c/o cough, chills, congestion, sneezing and feeling jittery X 4 days.   States he has taken otc cold medicine for relief.

## 2023-08-05 ENCOUNTER — Telehealth: Payer: Self-pay

## 2023-08-05 ENCOUNTER — Telehealth: Payer: Self-pay | Admitting: Nurse Practitioner

## 2023-08-05 DIAGNOSIS — U071 COVID-19: Secondary | ICD-10-CM

## 2023-08-05 LAB — SARS CORONAVIRUS 2 (TAT 6-24 HRS): SARS Coronavirus 2: POSITIVE — AB

## 2023-08-05 MED ORDER — PAXLOVID (300/100) 20 X 150 MG & 10 X 100MG PO TBPK: 3.0000 | ORAL_TABLET | Freq: Two times a day (BID) | ORAL | 0 refills | Status: AC

## 2023-08-05 NOTE — Telephone Encounter (Signed)
Pt seen on 9/19 for COVID sx, +COVID PCR. Pt on day 5 today. GFR 80.57 from labs in April 2024. Rx paxlovid sent to pharmacy. Due to drug interactions, pt to stop tamsulosin, viagra, and lipitor while taking the medication and for 3 days after completion of paxlovid. Also take 1/2 dose of amlodipine, which would be 2.5mg , while on paxlovid and for 3 days after completion. Nursing to inform patient

## 2023-08-05 NOTE — Telephone Encounter (Signed)
Pt  called back and was given instructions and medication interactions.  Due to drug interactions, pt to stop tamsulosin, viagra, and lipitor while taking the medication and for 3 days after completion of paxlovid. Also take 1/2 dose of amlodipine, which would be 2.5mg , while on paxlovid and for 3 days after completion.  Pt states he is not on any of the above medications except for the amlodipine.  Pt verbalized understanding.

## 2023-08-05 NOTE — Telephone Encounter (Signed)
Pt called and saw that he had a positive covid test and would like [place] to call in Paxlovid for him. Notified Christoper Fabian NP and she will call in for him.

## 2023-10-18 ENCOUNTER — Other Ambulatory Visit: Payer: Self-pay | Admitting: Acute Care

## 2023-10-18 ENCOUNTER — Ambulatory Visit (INDEPENDENT_AMBULATORY_CARE_PROVIDER_SITE_OTHER): Payer: Medicare Other | Admitting: Internal Medicine

## 2023-10-18 ENCOUNTER — Encounter: Payer: Self-pay | Admitting: Internal Medicine

## 2023-10-18 VITALS — BP 126/80 | HR 82 | Temp 98.2°F | Ht 75.0 in | Wt 213.0 lb

## 2023-10-18 DIAGNOSIS — Z122 Encounter for screening for malignant neoplasm of respiratory organs: Secondary | ICD-10-CM

## 2023-10-18 DIAGNOSIS — J439 Emphysema, unspecified: Secondary | ICD-10-CM

## 2023-10-18 DIAGNOSIS — J0140 Acute pansinusitis, unspecified: Secondary | ICD-10-CM | POA: Diagnosis not present

## 2023-10-18 DIAGNOSIS — F17210 Nicotine dependence, cigarettes, uncomplicated: Secondary | ICD-10-CM

## 2023-10-18 DIAGNOSIS — Z87891 Personal history of nicotine dependence: Secondary | ICD-10-CM

## 2023-10-18 MED ORDER — ALBUTEROL SULFATE HFA 108 (90 BASE) MCG/ACT IN AERS: 2.0000 | INHALATION_SPRAY | Freq: Four times a day (QID) | RESPIRATORY_TRACT | 1 refills | Status: DC | PRN

## 2023-10-18 MED ORDER — DOXYCYCLINE HYCLATE 100 MG PO TABS: 100.0000 mg | ORAL_TABLET | Freq: Two times a day (BID) | ORAL | 0 refills | Status: DC

## 2023-10-18 NOTE — Assessment & Plan Note (Addendum)
Started viral and now persistent Discussed that it is related to smoking---should quit Analgesics for sore throat/myalgias Will give doxy 100mg  bid x 7 days

## 2023-10-18 NOTE — Assessment & Plan Note (Signed)
Will hold off on prednisone but Rx for albuterol (refill)

## 2023-10-18 NOTE — Progress Notes (Signed)
Subjective:    Patient ID: Patrick Chavez, male    DOB: 1956/08/13, 67 y.o.   MRN: [id number]  HPI Here due to respiratory illness  Has been sick for a week Throat sore===nose is burning Headache--frontal Taking cold medicine--may help slightly Feels cold at night--no sweats No fever Body pain/myalgias  Some cough, sneezing Runny nose--and some post nasal drip Some SOB/wheeze--relates to the cigarettes  Current Outpatient Medications on File Prior to Visit  Medication Sig Dispense Refill   acetaminophen (TYLENOL) 325 MG tablet Take 2 tablets (650 mg total) by mouth every 6 (six) hours as needed for moderate pain. 30 tablet 0   amLODipine (NORVASC) 5 MG tablet Take 1 tablet (5 mg total) by mouth daily. 90 tablet 3   aspirin 81 MG tablet Take 1 tablet (81 mg total) by mouth daily. 30 tablet 1   atorvastatin (LIPITOR) 10 MG tablet Take 1 tablet (10 mg total) by mouth daily. 90 tablet 3   ibuprofen (ADVIL) 800 MG tablet Take 1 tablet (800 mg total) by mouth every 8 (eight) hours as needed (pain). 21 tablet 0   Multiple Vitamin (MULTIVITAMIN WITH MINERALS) TABS Take 1 tablet by mouth daily.     tamsulosin (FLOMAX) 0.4 MG CAPS capsule TAKE 1 CAPSULE BY MOUTH DAILY 90 capsule 3   albuterol (VENTOLIN HFA) 108 (90 Base) MCG/ACT inhaler Inhale 1-2 puffs into the lungs every 6 (six) hours as needed for wheezing or shortness of breath. 18 g 0   No current facility-administered medications on file prior to visit.    Allergies  Allergen Reactions   Tamsulosin Nausea Only    Past Medical History:  Diagnosis Date   BPH with obstruction/lower urinary tract symptoms    Hepatitis C last year   was treated   Hypertension     Past Surgical History:  Procedure Laterality Date   CATARACT EXTRACTION W/ INTRAOCULAR LENS IMPLANT Bilateral    TONSILLECTOMY      Family History  Problem Relation Age of Onset   Hypertension Mother    Diabetes Mother    Cancer Mother    Glaucoma Mother     Pancreatic cancer Mother    Cancer Father    Throat cancer Father    Stomach cancer Father    Hypertension Other     Social History   Socioeconomic History   Marital status: Married    Spouse name: Not on file   Number of children: 7   Years of education: Not on file   Highest education level: Not on file  Occupational History   Occupation: Warehouse work    Comment: retired   Occupation: Special needs supervision--adults    Comment: full time   Occupation: Journalist, newspaper: [employer]  Tobacco Use   Smoking status: Every Day    Current packs/day: 1.00    Types: Cigarettes    Passive exposure: Past   Smokeless tobacco: Never  Substance and Sexual Activity   Alcohol use: Yes    Comment: Rare   Drug use: No   Sexual activity: Not on file  Other Topics Concern   Not on file  Social History Narrative   3 children of his own   4 step children      No living will   Wife would be health care POA--then kids   Would accept resuscitaion   Not sure about tube feeds---not if cognitively unaware   Social Determinants of Corporate investment banker  Strain: Not on file  Food Insecurity: Not on file  Transportation Needs: Not on file  Physical Activity: Not on file  Stress: Not on file  Social Connections: Not on file  Intimate Partner Violence: Not on file   Review of Systems No loss of smell or taste No N/V Appetite is still okay Didn't test for COVID    Objective:   Physical Exam Constitutional:      Appearance: Normal appearance.  HENT:     Head:     Comments: Frontal and maxillary pressure    Right Ear: Tympanic membrane and ear canal normal.     Left Ear: Tympanic membrane and ear canal normal.     Mouth/Throat:     Pharynx: No oropharyngeal exudate or posterior oropharyngeal erythema.  Neck:     Comments: Mildly tender anterior cervical nodes Pulmonary:     Effort: Pulmonary effort is normal.     Comments: Mildly reduced breath  sounds Not really tight  Minimal wheezing Musculoskeletal:     Cervical back: Neck supple.  Neurological:     Mental Status: He is alert.            Assessment & Plan:

## 2023-10-19 ENCOUNTER — Encounter: Payer: Self-pay | Admitting: Internal Medicine

## 2023-10-20 MED ORDER — AMOXICILLIN-POT CLAVULANATE 875-125 MG PO TABS: 1.0000 | ORAL_TABLET | Freq: Two times a day (BID) | ORAL | 0 refills | Status: DC

## 2023-10-20 NOTE — Addendum Note (Signed)
Addended by: Tillman Abide I on: 10/20/2023 01:02 PM   Modules accepted: Orders

## 2023-10-28 ENCOUNTER — Ambulatory Visit
Admission: EM | Admit: 2023-10-28 | Discharge: 2023-10-28 | Disposition: A | Discharge status: Home / Self Care | Payer: Medicare Other | Attending: Internal Medicine | Admitting: Internal Medicine

## 2023-10-28 ENCOUNTER — Ambulatory Visit (INDEPENDENT_AMBULATORY_CARE_PROVIDER_SITE_OTHER): Payer: Medicare Other

## 2023-10-28 DIAGNOSIS — J209 Acute bronchitis, unspecified: Secondary | ICD-10-CM | POA: Diagnosis not present

## 2023-10-28 DIAGNOSIS — R059 Cough, unspecified: Secondary | ICD-10-CM | POA: Diagnosis not present

## 2023-10-28 DIAGNOSIS — R051 Acute cough: Secondary | ICD-10-CM

## 2023-10-28 MED ORDER — PREDNISONE 20 MG PO TABS: 40.0000 mg | ORAL_TABLET | Freq: Every day | ORAL | 0 refills | Status: AC

## 2023-10-28 MED ORDER — PROMETHAZINE-DM 6.25-15 MG/5ML PO SYRP: 5.0000 mL | ORAL_SOLUTION | Freq: Three times a day (TID) | ORAL | 0 refills | Status: DC | PRN

## 2023-10-28 NOTE — ED Provider Notes (Addendum)
[facility]    CSN: [id number] Arrival date & time: 10/28/23  5366      History   Chief Complaint Chief Complaint  Patient presents with   Headache   Diarrhea   Cough    HPI Patrick Chavez is a 67 y.o. male  presents for evaluation of URI symptoms for 3 weeks.  Patient reports 3 weeks of congestion, cough, malaise.  He saw his PCP on December 3 and was treated for sinus infection with doxycycline.  Patient reports he completed that a couple days ago and states his symptoms improved but did not completely resolved.  Reports he still has a cough with congestion and now has diarrhea, HA's.  Denies any fevers but endorses chills.  No nausea/vomiting, body aches, ST, shortness of breath.  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  Denies history of COPD.  No known sick contacts.  No other concerns at this time.   Headache Associated symptoms: congestion, cough, diarrhea and fatigue   Diarrhea Associated symptoms: headaches   Cough Associated symptoms: headaches     Past Medical History:  Diagnosis Date   BPH with obstruction/lower urinary tract symptoms    Hepatitis C last year   was treated   Hypertension     Patient Active Problem List   Diagnosis Date Noted   Acute non-recurrent pansinusitis 10/18/2023   Preventative health care 02/21/2023   Pleuritic chest pain 01/12/2023   Emphysema lung (HCC) 12/06/2022   Aortic atherosclerosis (HCC) 12/06/2022   BPH with obstruction/lower urinary tract symptoms 08/20/2022   Hypertension 08/20/2022   Smoker 08/20/2022   Osteoarthritis of right hip 08/20/2022    Past Surgical History:  Procedure Laterality Date   CATARACT EXTRACTION W/ INTRAOCULAR LENS IMPLANT Bilateral    TONSILLECTOMY         Home Medications    Prior to Admission medications   Medication Sig Start Date End Date Taking? Authorizing Provider  acetaminophen (TYLENOL) 325 MG tablet Take 2 tablets (650 mg total) by mouth every 6  (six) hours as needed for moderate pain. 08/04/23  Yes Wallis Bamberg, PA-C  albuterol (VENTOLIN HFA) 108 (90 Base) MCG/ACT inhaler Inhale 2 puffs into the lungs every 6 (six) hours as needed for wheezing or shortness of breath. 10/18/23  Yes Tillman Abide I, MD  amLODipine (NORVASC) 5 MG tablet Take 1 tablet (5 mg total) by mouth daily. 01/12/23  Yes Karie Schwalbe, MD  aspirin 81 MG tablet Take 1 tablet (81 mg total) by mouth daily. 09/16/15  Yes Richarda Overlie, MD  predniSONE (DELTASONE) 20 MG tablet Take 2 tablets (40 mg total) by mouth daily with breakfast for 5 days. 10/28/23 11/02/23 Yes Radford Pax, NP  promethazine-dextromethorphan (PROMETHAZINE-DM) 6.25-15 MG/5ML syrup Take 5 mLs by mouth 3 (three) times daily as needed for cough. 10/28/23  Yes Radford Pax, NP  amoxicillin-clavulanate (AUGMENTIN) 875-125 MG tablet Take 1 tablet by mouth 2 (two) times daily. 10/20/23   Karie Schwalbe, MD  atorvastatin (LIPITOR) 10 MG tablet Take 1 tablet (10 mg total) by mouth daily. 02/21/23   Karie Schwalbe, MD  ibuprofen (ADVIL) 800 MG tablet Take 1 tablet (800 mg total) by mouth every 8 (eight) hours as needed (pain). 02/10/23   Zenia Resides, MD  Multiple Vitamin (MULTIVITAMIN WITH MINERALS) TABS Take 1 tablet by mouth daily.    [provider]  tamsulosin (FLOMAX) 0.4 MG CAPS capsule TAKE 1 CAPSULE BY MOUTH DAILY 03/14/23  Karie Schwalbe, MD    Family History Family History  Problem Relation Age of Onset   Hypertension Mother    Diabetes Mother    Cancer Mother    Glaucoma Mother    Pancreatic cancer Mother    Cancer Father    Throat cancer Father    Stomach cancer Father    Hypertension Other     Social History Social History   Tobacco Use   Smoking status: Every Day    Current packs/day: 1.00    Types: Cigarettes    Passive exposure: Past   Smokeless tobacco: Never  Vaping Use   Vaping status: Never Used  Substance Use Topics   Alcohol use: Yes    Comment:  Rare   Drug use: No     Allergies   Tamsulosin   Review of Systems Review of Systems  Constitutional:  Positive for fatigue.  HENT:  Positive for congestion.   Respiratory:  Positive for cough.   Gastrointestinal:  Positive for diarrhea.  Neurological:  Positive for headaches.     Physical Exam Triage Vital Signs ED Triage Vitals  Encounter Vitals Group     BP 10/28/23 0948 (!) 131/91     Systolic BP Percentile --      Diastolic BP Percentile --      Pulse Rate 10/28/23 0948 95     Resp 10/28/23 0948 19     Temp 10/28/23 0948 98.8 F (37.1 C)     Temp Source 10/28/23 0948 Oral     SpO2 10/28/23 0948 95 %     Weight --      Height --      Head Circumference --      Peak Flow --      Pain Score 10/28/23 0947 0     Pain Loc --      Pain Education --      Exclude from Growth Chart --    No data found.  Updated Vital Signs BP (!) 131/91 (BP Location: Right Arm)   Pulse 95   Temp 98.8 F (37.1 C) (Oral)   Resp 19   SpO2 95%   Visual Acuity Right Eye Distance:   Left Eye Distance:   Bilateral Distance:    Right Eye Near:   Left Eye Near:    Bilateral Near:     Physical Exam Vitals and nursing note reviewed.  Constitutional:      General: He is not in acute distress.    Appearance: Normal appearance. He is not ill-appearing or toxic-appearing.  HENT:     Head: Normocephalic and atraumatic.     Right Ear: Tympanic membrane and ear canal normal.     Left Ear: Tympanic membrane and ear canal normal.     Nose: Congestion present.     Right Turbinates: Pale. Not swollen.     Left Turbinates: Pale. Not swollen.     Right Sinus: Maxillary sinus tenderness present. No frontal sinus tenderness.     Left Sinus: Maxillary sinus tenderness present. No frontal sinus tenderness.     Mouth/Throat:     Mouth: Mucous membranes are moist.     Pharynx: No oropharyngeal exudate or posterior oropharyngeal erythema.  Eyes:     Pupils: Pupils are equal, round, and  reactive to light.  Cardiovascular:     Rate and Rhythm: Normal rate and regular rhythm.     Heart sounds: Normal heart sounds.  Pulmonary:     Effort: Pulmonary  effort is normal.     Breath sounds: Normal breath sounds.  Musculoskeletal:     Cervical back: Normal range of motion and neck supple.  Lymphadenopathy:     Cervical: No cervical adenopathy.  Skin:    General: Skin is warm and dry.  Neurological:     General: No focal deficit present.     Mental Status: He is alert and oriented to person, place, and time.  Psychiatric:        Mood and Affect: Mood normal.        Behavior: Behavior normal.      UC Treatments / Results  Labs (all labs ordered are listed, but only abnormal results are displayed) Labs Reviewed - No data to display  EKG   Radiology No results found.  Procedures Procedures (including critical care time)  Medications Ordered in UC Medications - No data to display  Initial Impression / Assessment and Plan / UC Course  I have reviewed the triage vital signs and the nursing notes.  Pertinent labs & imaging results that were available during my care of the patient were reviewed by me and considered in my medical decision making (see chart for details).     Reviewed exam and symptoms with patient.  No red flags.  Wet read of x-ray without obvious consolidation, will contact for any positive results based on radiology overread.  Discussed with patient's do not feel additional antibiotics are indicated at this time as he just completed a round of doxycycline.  Trial of prednisone and Promethazine DM.  He is to follow-up with his PCP in 2 to 3 days for recheck.  ER precautions reviewed.  Addendum 1130: Chest x-ray negative, no change in plan of care Final Clinical Impressions(s) / UC Diagnoses   Final diagnoses:  Acute cough  Acute bronchitis, unspecified organism     Discharge Instructions      Start prednisone for 5 days.  You may take  Promethazine DM as needed for cough.  Please of this medication, get drowsy.  Do not drink alcohol drive on this medication.  Lots of rest and fluids.  Please follow-up with your PCP in 2 to 3 days for recheck.  Please go to the ER for any worsening symptoms.  I hope you feel better soon!     ED Prescriptions     Medication Sig Dispense Auth. Provider   predniSONE (DELTASONE) 20 MG tablet Take 2 tablets (40 mg total) by mouth daily with breakfast for 5 days. 10 tablet Radford Pax, NP   promethazine-dextromethorphan (PROMETHAZINE-DM) 6.25-15 MG/5ML syrup Take 5 mLs by mouth 3 (three) times daily as needed for cough. 118 mL Radford Pax, NP      PDMP not reviewed this encounter.   Radford Pax, NP 10/28/23 1053    Radford Pax, NP 10/28/23 1130

## 2023-10-28 NOTE — Discharge Instructions (Signed)
Start prednisone for 5 days.  You may take Promethazine DM as needed for cough.  Please of this medication, get drowsy.  Do not drink alcohol drive on this medication.  Lots of rest and fluids.  Please follow-up with your PCP in 2 to 3 days for recheck.  Please go to the ER for any worsening symptoms.  I hope you feel better soon!

## 2023-10-28 NOTE — ED Triage Notes (Signed)
Sick for 3 weeks. Patient took a course of Augmentin  on 10/20/23.   Patient is still experiencing  Runny nose  Productive cough Slight headache  Diarrhea (just started this week)

## 2023-11-28 ENCOUNTER — Ambulatory Visit
Admission: RE | Admit: 2023-11-28 | Discharge: 2023-11-28 | Disposition: A | Discharge status: Home / Self Care | Payer: No Typology Code available for payment source | Source: Ambulatory Visit | Attending: Internal Medicine | Admitting: Internal Medicine

## 2023-11-28 DIAGNOSIS — Z87891 Personal history of nicotine dependence: Secondary | ICD-10-CM | POA: Diagnosis present

## 2023-11-28 DIAGNOSIS — F17210 Nicotine dependence, cigarettes, uncomplicated: Secondary | ICD-10-CM | POA: Insufficient documentation

## 2023-11-28 DIAGNOSIS — Z122 Encounter for screening for malignant neoplasm of respiratory organs: Secondary | ICD-10-CM | POA: Diagnosis not present

## 2023-11-28 DIAGNOSIS — J439 Emphysema, unspecified: Secondary | ICD-10-CM | POA: Insufficient documentation

## 2023-11-28 DIAGNOSIS — I700 Atherosclerosis of aorta: Secondary | ICD-10-CM | POA: Diagnosis not present

## 2023-12-05 ENCOUNTER — Other Ambulatory Visit: Payer: Self-pay

## 2023-12-05 DIAGNOSIS — Z122 Encounter for screening for malignant neoplasm of respiratory organs: Secondary | ICD-10-CM

## 2023-12-05 DIAGNOSIS — Z87891 Personal history of nicotine dependence: Secondary | ICD-10-CM

## 2023-12-05 DIAGNOSIS — F17210 Nicotine dependence, cigarettes, uncomplicated: Secondary | ICD-10-CM

## 2023-12-08 ENCOUNTER — Other Ambulatory Visit: Payer: Self-pay | Admitting: Internal Medicine

## 2023-12-26 ENCOUNTER — Ambulatory Visit
Admission: EM | Admit: 2023-12-26 | Discharge: 2023-12-26 | Disposition: A | Discharge status: Home / Self Care | Payer: No Typology Code available for payment source | Attending: Family Medicine | Admitting: Family Medicine

## 2023-12-26 DIAGNOSIS — J208 Acute bronchitis due to other specified organisms: Secondary | ICD-10-CM

## 2023-12-26 DIAGNOSIS — J438 Other emphysema: Secondary | ICD-10-CM | POA: Diagnosis not present

## 2023-12-26 DIAGNOSIS — B9689 Other specified bacterial agents as the cause of diseases classified elsewhere: Secondary | ICD-10-CM

## 2023-12-26 MED ORDER — PREDNISONE 20 MG PO TABS: ORAL_TABLET | ORAL | 0 refills | Status: DC

## 2023-12-26 MED ORDER — PROMETHAZINE-DM 6.25-15 MG/5ML PO SYRP: 5.0000 mL | ORAL_SOLUTION | Freq: Three times a day (TID) | ORAL | 0 refills | Status: DC | PRN

## 2023-12-26 MED ORDER — ALBUTEROL SULFATE HFA 108 (90 BASE) MCG/ACT IN AERS: 1.0000 | INHALATION_SPRAY | RESPIRATORY_TRACT | 0 refills | Status: DC | PRN

## 2023-12-26 MED ORDER — AZITHROMYCIN 250 MG PO TABS: ORAL_TABLET | ORAL | 0 refills | Status: DC

## 2023-12-26 NOTE — ED Triage Notes (Signed)
 Pt c/o cough, head/chest congestion, body aches, dizziness-sx started 2/5-NAD-steady gaIt

## 2023-12-26 NOTE — ED Provider Notes (Signed)
 [facility]  Note:  This document was prepared using Conservation officer, historic buildings and may include unintentional dictation errors.  MRN: [id number] DOB: Sep 25, 1956  Subjective:   Patrick Chavez is a 68 y.o. male presenting for 5-day history of persistent coughing, chest congestion, chest tightness, throat pain, body pains.  No fever.  Needs a refill on his inhaler.  He is not interested in testing.  He did a COVID test over the weekend and was negative.  Patient still smokes.  Has a history of emphysema and bronchitis.  No current facility-administered medications for this encounter.  Current Outpatient Medications:    acetaminophen  (TYLENOL ) 325 MG tablet, Take 2 tablets (650 mg total) by mouth every 6 (six) hours as needed for moderate pain., Disp: 30 tablet, Rfl: 0   albuterol  (VENTOLIN  HFA) 108 (90 Base) MCG/ACT inhaler, INHALE 2 PUFFS BY MOUTH EVERY 6 HOURS AS NEEDED FOR WHEEZE OR SHORTNESS OF BREATH, Disp: 8.5 each, Rfl: 1   amLODipine  (NORVASC ) 5 MG tablet, Take 1 tablet (5 mg total) by mouth daily., Disp: 90 tablet, Rfl: 3   amoxicillin -clavulanate (AUGMENTIN ) 875-125 MG tablet, Take 1 tablet by mouth 2 (two) times daily., Disp: 14 tablet, Rfl: 0   aspirin  81 MG tablet, Take 1 tablet (81 mg total) by mouth daily., Disp: 30 tablet, Rfl: 1   atorvastatin  (LIPITOR) 10 MG tablet, Take 1 tablet (10 mg total) by mouth daily., Disp: 90 tablet, Rfl: 3   ibuprofen  (ADVIL ) 800 MG tablet, Take 1 tablet (800 mg total) by mouth every 8 (eight) hours as needed (pain)., Disp: 21 tablet, Rfl: 0   Multiple Vitamin (MULTIVITAMIN WITH MINERALS) TABS, Take 1 tablet by mouth daily., Disp: , Rfl:    promethazine -dextromethorphan (PROMETHAZINE -DM) 6.25-15 MG/5ML syrup, Take 5 mLs by mouth 3 (three) times daily as needed for cough., Disp: 118 mL, Rfl: 0   tamsulosin  (FLOMAX ) 0.4 MG CAPS capsule, TAKE 1 CAPSULE BY MOUTH DAILY, Disp: 90 capsule, Rfl: 3   Allergies  Allergen  Reactions   Tamsulosin  Nausea Only    Past Medical History:  Diagnosis Date   BPH with obstruction/lower urinary tract symptoms    Hepatitis C last year   was treated   Hypertension      Past Surgical History:  Procedure Laterality Date   CATARACT EXTRACTION W/ INTRAOCULAR LENS IMPLANT Bilateral    TONSILLECTOMY      Family History  Problem Relation Age of Onset   Hypertension Mother    Diabetes Mother    Cancer Mother    Glaucoma Mother    Pancreatic cancer Mother    Cancer Father    Throat cancer Father    Stomach cancer Father    Hypertension Other     Social History   Tobacco Use   Smoking status: Every Day    Current packs/day: 1.00    Types: Cigarettes    Passive exposure: Past   Smokeless tobacco: Never  Vaping Use   Vaping status: Never Used  Substance Use Topics   Alcohol use: Yes    Comment: Rare   Drug use: No    ROS   Objective:   Vitals: BP (!) 146/91 (BP Location: Right Arm)   Pulse 91   Temp 98.9 F (37.2 C) (Oral)   Resp 20   SpO2 94%   Physical Exam Constitutional:      General: He is not in acute distress.    Appearance: Normal appearance. He is well-developed and normal  weight. He is not ill-appearing, toxic-appearing or diaphoretic.  HENT:     Head: Normocephalic and atraumatic.     Right Ear: External ear normal.     Left Ear: External ear normal.     Nose: Congestion present. No rhinorrhea.     Mouth/Throat:     Mouth: Mucous membranes are moist.     Pharynx: No pharyngeal swelling, oropharyngeal exudate, posterior oropharyngeal erythema or uvula swelling.     Tonsils: No tonsillar exudate or tonsillar abscesses. 0 on the right. 0 on the left.  Eyes:     General: No scleral icterus.       Right eye: No discharge.        Left eye: No discharge.     Extraocular Movements: Extraocular movements intact.  Cardiovascular:     Rate and Rhythm: Normal rate and regular rhythm.     Heart sounds: Normal heart sounds. No murmur  heard.    No friction rub. No gallop.  Pulmonary:     Effort: Pulmonary effort is normal. No respiratory distress.     Breath sounds: No stridor. Wheezing and rhonchi present. No rales.  Musculoskeletal:     Cervical back: Normal range of motion.  Neurological:     Mental Status: He is alert and oriented to person, place, and time.  Psychiatric:        Mood and Affect: Mood normal.        Behavior: Behavior normal.        Thought Content: Thought content normal.        Judgment: Judgment normal.     Assessment and Plan :   PDMP not reviewed this encounter.  1. Acute bacterial bronchitis   2. Other emphysema (HCC)    Patient declined any kind of testing.  Recommended azithromycin  and prednisone  for bacterial bronchitis.  Use supportive care otherwise.  Refilled his albuterol .  Counseled patient on potential for adverse effects with medications prescribed/recommended today, ER and return-to-clinic precautions discussed, patient verbalized understanding.    Adolph Hoop, [place] 12/26/23 [phone]

## 2024-01-01 ENCOUNTER — Ambulatory Visit
Admission: EM | Admit: 2024-01-01 | Discharge: 2024-01-01 | Disposition: A | Discharge status: Home / Self Care | Payer: No Typology Code available for payment source | Attending: Family Medicine | Admitting: Family Medicine

## 2024-01-01 DIAGNOSIS — J209 Acute bronchitis, unspecified: Secondary | ICD-10-CM | POA: Diagnosis not present

## 2024-01-01 DIAGNOSIS — J018 Other acute sinusitis: Secondary | ICD-10-CM | POA: Diagnosis not present

## 2024-01-01 LAB — POC COVID19/FLU A&B COMBO
Covid Antigen, POC: NEGATIVE
Influenza A Antigen, POC: NEGATIVE
Influenza B Antigen, POC: NEGATIVE

## 2024-01-01 MED ORDER — AMOXICILLIN-POT CLAVULANATE 875-125 MG PO TABS: 1.0000 | ORAL_TABLET | Freq: Two times a day (BID) | ORAL | 0 refills | Status: DC

## 2024-01-01 NOTE — ED Triage Notes (Signed)
 Pt reports cough, runny nose, back pain, chest congestion x 3 days. States he was told dl he had bronchitis.

## 2024-01-01 NOTE — ED Provider Notes (Signed)
 [facility]  Note:  This document was prepared using Conservation officer, historic buildings and may include unintentional dictation errors.  MRN: [id number] DOB: Jan 03, 1956  Subjective:   Patrick Chavez is a 68 y.o. male presenting for recheck on persistent congestion, difficulty with his breathing, sinus drainage and sinus headaches, sinus pain.  Was seen 12/26/2023 and started on azithromycin, prednisone, albuterol for bronchitis.  This has helped him but his sinuses continue to bother him.  No current facility-administered medications for this encounter.  Current Outpatient Medications:    acetaminophen (TYLENOL) 325 MG tablet, Take 2 tablets (650 mg total) by mouth every 6 (six) hours as needed for moderate pain., Disp: 30 tablet, Rfl: 0   albuterol (VENTOLIN HFA) 108 (90 Base) MCG/ACT inhaler, Inhale 1-2 puffs into the lungs every 4 (four) hours as needed for wheezing or shortness of breath., Disp: 8.5 each, Rfl: 0   amLODipine (NORVASC) 5 MG tablet, Take 1 tablet (5 mg total) by mouth daily., Disp: 90 tablet, Rfl: 3   amoxicillin-clavulanate (AUGMENTIN) 875-125 MG tablet, Take 1 tablet by mouth 2 (two) times daily., Disp: 14 tablet, Rfl: 0   aspirin 81 MG tablet, Take 1 tablet (81 mg total) by mouth daily., Disp: 30 tablet, Rfl: 1   atorvastatin (LIPITOR) 10 MG tablet, Take 1 tablet (10 mg total) by mouth daily., Disp: 90 tablet, Rfl: 3   azithromycin (ZITHROMAX) 250 MG tablet, Day 1: take 2 tablets. Day 2-5: Take 1 tablet daily., Disp: 6 tablet, Rfl: 0   ibuprofen (ADVIL) 800 MG tablet, Take 1 tablet (800 mg total) by mouth every 8 (eight) hours as needed (pain)., Disp: 21 tablet, Rfl: 0   Multiple Vitamin (MULTIVITAMIN WITH MINERALS) TABS, Take 1 tablet by mouth daily., Disp: , Rfl:    predniSONE (DELTASONE) 20 MG tablet, Take 2 tablets daily with breakfast., Disp: 10 tablet, Rfl: 0   promethazine-dextromethorphan (PROMETHAZINE-DM) 6.25-15 MG/5ML syrup, Take 5 mLs  by mouth 3 (three) times daily as needed for cough., Disp: 200 mL, Rfl: 0   tamsulosin (FLOMAX) 0.4 MG CAPS capsule, TAKE 1 CAPSULE BY MOUTH DAILY, Disp: 90 capsule, Rfl: 3   Allergies  Allergen Reactions   Tamsulosin Nausea Only    Past Medical History:  Diagnosis Date   BPH with obstruction/lower urinary tract symptoms    Hepatitis C last year   was treated   Hypertension      Past Surgical History:  Procedure Laterality Date   CATARACT EXTRACTION W/ INTRAOCULAR LENS IMPLANT Bilateral    TONSILLECTOMY      Family History  Problem Relation Age of Onset   Hypertension Mother    Diabetes Mother    Cancer Mother    Glaucoma Mother    Pancreatic cancer Mother    Cancer Father    Throat cancer Father    Stomach cancer Father    Hypertension Other     Social History   Tobacco Use   Smoking status: Every Day    Current packs/day: 1.00    Types: Cigarettes    Passive exposure: Past   Smokeless tobacco: Never  Vaping Use   Vaping status: Never Used  Substance Use Topics   Alcohol use: Yes    Comment: Rare   Drug use: No    ROS   Objective:   Vitals: BP (!) 147/83 (BP Location: Right Arm)   Pulse (!) 106   Temp 99.2 F (37.3 C) (Oral)   Resp 18   SpO2  95%   Physical Exam Constitutional:      General: He is not in acute distress.    Appearance: Normal appearance. He is well-developed and normal weight. He is not ill-appearing, toxic-appearing or diaphoretic.  HENT:     Head: Normocephalic and atraumatic.     Right Ear: Tympanic membrane, ear canal and external ear normal. No drainage, swelling or tenderness. No middle ear effusion. There is no impacted cerumen. Tympanic membrane is not erythematous or bulging.     Left Ear: Tympanic membrane, ear canal and external ear normal. No drainage, swelling or tenderness.  No middle ear effusion. There is no impacted cerumen. Tympanic membrane is not erythematous or bulging.     Nose: Congestion and rhinorrhea  present.     Mouth/Throat:     Mouth: Mucous membranes are moist.     Pharynx: Posterior oropharyngeal erythema present. No oropharyngeal exudate.  Eyes:     General: No scleral icterus.       Right eye: No discharge.        Left eye: No discharge.     Extraocular Movements: Extraocular movements intact.     Conjunctiva/sclera: Conjunctivae normal.  Cardiovascular:     Rate and Rhythm: Normal rate and regular rhythm.     Heart sounds: Normal heart sounds. No murmur heard.    No friction rub. No gallop.  Pulmonary:     Effort: Pulmonary effort is normal. No respiratory distress.     Breath sounds: Normal breath sounds. No stridor. No wheezing, rhonchi or rales.  Musculoskeletal:     Cervical back: Normal range of motion and neck supple. No rigidity. No muscular tenderness.  Neurological:     General: No focal deficit present.     Mental Status: He is alert and oriented to person, place, and time.  Psychiatric:        Mood and Affect: Mood normal.        Behavior: Behavior normal.        Thought Content: Thought content normal.     Results for orders placed or performed during the hospital encounter of 01/01/24 (from the past 24 hours)  POC Covid19/Flu A&B Antigen     Status: None   Collection Time: 01/01/24  2:33 PM  Result Value Ref Range   Influenza A Antigen, POC Negative Negative   Influenza B Antigen, POC Negative Negative   Covid Antigen, POC Negative Negative    Assessment and Plan :   PDMP not reviewed this encounter.  1. Acute non-recurrent sinusitis of other sinus   2. Acute bronchitis, unspecified organism    Will cover for sinusitis with Augmentin.  Use supportive care otherwise.  Counseled patient on potential for adverse effects with medications prescribed/recommended today, ER and return-to-clinic precautions discussed, patient verbalized understanding.    Wallis Bamberg, [place] 01/01/24 1439

## 2024-01-01 NOTE — Discharge Instructions (Signed)
 Keep using albuterol.  Use cough syrup as needed.  Start Augmentin to help with the secondary sinus infection.

## 2024-01-03 ENCOUNTER — Ambulatory Visit (INDEPENDENT_AMBULATORY_CARE_PROVIDER_SITE_OTHER): Payer: No Typology Code available for payment source | Admitting: Internal Medicine

## 2024-01-03 ENCOUNTER — Ambulatory Visit
Admission: RE | Admit: 2024-01-03 | Discharge: 2024-01-03 | Disposition: A | Discharge status: Home / Self Care | Payer: No Typology Code available for payment source | Source: Ambulatory Visit | Attending: Internal Medicine | Admitting: Internal Medicine

## 2024-01-03 ENCOUNTER — Encounter: Payer: Self-pay | Admitting: Internal Medicine

## 2024-01-03 VITALS — BP 126/82 | HR 92 | Temp 98.2°F | Ht 75.0 in | Wt 215.0 lb

## 2024-01-03 DIAGNOSIS — R251 Tremor, unspecified: Secondary | ICD-10-CM

## 2024-01-03 DIAGNOSIS — R059 Cough, unspecified: Secondary | ICD-10-CM | POA: Diagnosis not present

## 2024-01-03 DIAGNOSIS — J441 Chronic obstructive pulmonary disease with (acute) exacerbation: Secondary | ICD-10-CM

## 2024-01-03 DIAGNOSIS — R062 Wheezing: Secondary | ICD-10-CM | POA: Diagnosis not present

## 2024-01-03 MED ORDER — PREDNISONE 20 MG PO TABS: 40.0000 mg | ORAL_TABLET | Freq: Every day | ORAL | 0 refills | Status: DC

## 2024-01-03 MED ORDER — NICOTINE 21 MG/24HR TD PT24: 21.0000 mg | MEDICATED_PATCH | Freq: Every day | TRANSDERMAL | 5 refills | Status: DC

## 2024-01-03 NOTE — Assessment & Plan Note (Addendum)
 Did improve with prednisone--but finished it Clearly has sinus symptoms--hard to tell in chest, so will check CXR CXR doesn't show pneumonia Will continue the augmentin Restart prednisone 40mg  x 5 days, then 20 x 5 days Is reducing the cigarettes--try nicotine patches

## 2024-01-03 NOTE — Progress Notes (Signed)
 Subjective:    Patient ID: Patrick Chavez, male    DOB: [birth year], 68 y.o.   MRN: [id number]  HPI Here due to respiratory infection---and a tremor  Started over a week ago--seen 2/10 Got prednisone and azithromycin Went back 2 days ago---given augmentin Is hearing wheezing Some SOB---after talking or working out briefly Head congestion  Cough with some sputum---no blood No ear pain Has frontal pain   Has cut back on the cigarettes--especially with this illness  Has left hand tremor--goes back a while Now is worse---is right handed though No change in gait Handwriting is the same  Current Outpatient Medications on File Prior to Visit  Medication Sig Dispense Refill   acetaminophen (TYLENOL) 325 MG tablet Take 2 tablets (650 mg total) by mouth every 6 (six) hours as needed for moderate pain. 30 tablet 0   albuterol (VENTOLIN HFA) 108 (90 Base) MCG/ACT inhaler Inhale 1-2 puffs into the lungs every 4 (four) hours as needed for wheezing or shortness of breath. 8.5 each 0   amLODipine (NORVASC) 5 MG tablet Take 1 tablet (5 mg total) by mouth daily. 90 tablet 3   amoxicillin-clavulanate (AUGMENTIN) 875-125 MG tablet Take 1 tablet by mouth 2 (two) times daily. 20 tablet 0   aspirin 81 MG tablet Take 1 tablet (81 mg total) by mouth daily. 30 tablet 1   Multiple Vitamin (MULTIVITAMIN WITH MINERALS) TABS Take 1 tablet by mouth daily.     promethazine-dextromethorphan (PROMETHAZINE-DM) 6.25-15 MG/5ML syrup Take 5 mLs by mouth 3 (three) times daily as needed for cough. 200 mL 0   ibuprofen (ADVIL) 800 MG tablet Take 1 tablet (800 mg total) by mouth every 8 (eight) hours as needed (pain). (Patient not taking: Reported on 01/03/2024) 21 tablet 0   No current facility-administered medications on file prior to visit.    Allergies  Allergen Reactions   Tamsulosin Nausea Only    Past Medical History:  Diagnosis Date   BPH with obstruction/lower urinary tract symptoms    Hepatitis C last  year   was treated   Hypertension     Past Surgical History:  Procedure Laterality Date   CATARACT EXTRACTION W/ INTRAOCULAR LENS IMPLANT Bilateral    TONSILLECTOMY      Family History  Problem Relation Age of Onset   Hypertension Mother    Diabetes Mother    Cancer Mother    Glaucoma Mother    Pancreatic cancer Mother    Cancer Father    Throat cancer Father    Stomach cancer Father    Hypertension Other     Social History   Socioeconomic History   Marital status: Married    Spouse name: Not on file   Number of children: 7   Years of education: Not on file   Highest education level: Not on file  Occupational History   Occupation: Warehouse work    Comment: retired   Occupation: Special needs supervision--adults    Comment: full time   Occupation: Journalist, newspaper: [employer]  Tobacco Use   Smoking status: Every Day    Current packs/day: 1.00    Types: Cigarettes    Passive exposure: Past   Smokeless tobacco: Never  Vaping Use   Vaping status: Never Used  Substance and Sexual Activity   Alcohol use: Yes    Comment: Rare   Drug use: No   Sexual activity: Yes  Other Topics Concern   Not on file  Social History Narrative  3 children of his own   4 step children      No living will   Wife would be health care POA--then kids   Would accept resuscitaion   Not sure about tube feeds---not if cognitively unaware   Social Drivers of Health   Financial Resource Strain: Not on file  Food Insecurity: Not on file  Transportation Needs: Not on file  Physical Activity: Not on file  Stress: Not on file  Social Connections: Not on file  Intimate Partner Violence: Not on file   Review of Systems No loss of smell but taste is different Did have negative flu and COVID tests at urgent care No N/V Eating but appetite is down Needs some cough syrup to be able to sleep     Objective:   Physical Exam Constitutional:      Appearance: Normal  appearance.  HENT:     Head:     Comments: Mild frontal/maxillary tenderness    Right Ear: Tympanic membrane and ear canal normal.     Left Ear: Tympanic membrane and ear canal normal.     Mouth/Throat:     Pharynx: No oropharyngeal exudate or posterior oropharyngeal erythema.  Pulmonary:     Effort: Pulmonary effort is normal.     Breath sounds: No wheezing or rales.     Comments: Coarse cough Decreased breath sounds---slight exp phase prolongation  Musculoskeletal:     Cervical back: Neck supple.  Lymphadenopathy:     Cervical: No cervical adenopathy.  Neurological:     Mental Status: He is alert.     Comments: Normal gait Mild resting left hand tremor No bradykinesia Normal handwriting            Assessment & Plan:

## 2024-01-03 NOTE — Assessment & Plan Note (Signed)
 No other features now to suggest early Parkinson's or other neurologic disorder Not bad enough for meds Will consider referral to neuro if worsens

## 2024-01-16 ENCOUNTER — Encounter: Payer: Self-pay | Admitting: Internal Medicine

## 2024-01-22 DIAGNOSIS — J988 Other specified respiratory disorders: Secondary | ICD-10-CM | POA: Diagnosis not present

## 2024-01-31 DIAGNOSIS — H401131 Primary open-angle glaucoma, bilateral, mild stage: Secondary | ICD-10-CM | POA: Diagnosis not present

## 2024-01-31 DIAGNOSIS — H401111 Primary open-angle glaucoma, right eye, mild stage: Secondary | ICD-10-CM | POA: Diagnosis not present

## 2024-02-21 DIAGNOSIS — H401111 Primary open-angle glaucoma, right eye, mild stage: Secondary | ICD-10-CM | POA: Diagnosis not present

## 2024-02-22 ENCOUNTER — Encounter: Payer: Self-pay | Admitting: Internal Medicine

## 2024-02-22 ENCOUNTER — Ambulatory Visit (INDEPENDENT_AMBULATORY_CARE_PROVIDER_SITE_OTHER): Payer: Medicare Other | Admitting: Internal Medicine

## 2024-02-22 ENCOUNTER — Encounter: Payer: Self-pay | Admitting: Neurology

## 2024-02-22 VITALS — BP 132/76 | HR 100 | Temp 98.7°F | Ht 74.25 in | Wt 208.0 lb

## 2024-02-22 DIAGNOSIS — R251 Tremor, unspecified: Secondary | ICD-10-CM

## 2024-02-22 DIAGNOSIS — Z125 Encounter for screening for malignant neoplasm of prostate: Secondary | ICD-10-CM | POA: Diagnosis not present

## 2024-02-22 DIAGNOSIS — N401 Enlarged prostate with lower urinary tract symptoms: Secondary | ICD-10-CM

## 2024-02-22 DIAGNOSIS — Z0000 Encounter for general adult medical examination without abnormal findings: Secondary | ICD-10-CM | POA: Diagnosis not present

## 2024-02-22 DIAGNOSIS — J439 Emphysema, unspecified: Secondary | ICD-10-CM

## 2024-02-22 DIAGNOSIS — I700 Atherosclerosis of aorta: Secondary | ICD-10-CM | POA: Diagnosis not present

## 2024-02-22 DIAGNOSIS — N138 Other obstructive and reflux uropathy: Secondary | ICD-10-CM | POA: Diagnosis not present

## 2024-02-22 DIAGNOSIS — M150 Primary generalized (osteo)arthritis: Secondary | ICD-10-CM

## 2024-02-22 DIAGNOSIS — I10 Essential (primary) hypertension: Secondary | ICD-10-CM

## 2024-02-22 LAB — CBC
HCT: 40 % (ref 39.0–52.0)
Hemoglobin: 13.6 g/dL (ref 13.0–17.0)
MCHC: 34 g/dL (ref 30.0–36.0)
MCV: 93.8 fl (ref 78.0–100.0)
Platelets: 311 10*3/uL (ref 150.0–400.0)
RBC: 4.27 Mil/uL (ref 4.22–5.81)
RDW: 14.2 % (ref 11.5–15.5)
WBC: 4.7 10*3/uL (ref 4.0–10.5)

## 2024-02-22 LAB — COMPREHENSIVE METABOLIC PANEL WITH GFR
ALT: 12 U/L (ref 0–53)
AST: 17 U/L (ref 0–37)
Albumin: 4.4 g/dL (ref 3.5–5.2)
Alkaline Phosphatase: 61 U/L (ref 39–117)
BUN: 11 mg/dL (ref 6–23)
CO2: 26 meq/L (ref 19–32)
Calcium: 9.7 mg/dL (ref 8.4–10.5)
Chloride: 106 meq/L (ref 96–112)
Creatinine, Ser: 0.97 mg/dL (ref 0.40–1.50)
GFR: 81 mL/min (ref >60.00)
Glucose, Bld: 80 mg/dL (ref 70–99)
Potassium: 3.9 meq/L (ref 3.5–5.1)
Sodium: 140 meq/L (ref 135–145)
Total Bilirubin: 0.5 mg/dL (ref 0.2–1.2)
Total Protein: 7.2 g/dL (ref 6.0–8.3)

## 2024-02-22 LAB — LIPID PANEL
Cholesterol: 164 mg/dL (ref 0–200)
HDL: 35.4 mg/dL — ABNORMAL LOW (ref >39.00)
LDL Cholesterol: 79 mg/dL (ref 0–99)
NonHDL: 129.07
Total CHOL/HDL Ratio: 5
Triglycerides: 251 mg/dL — ABNORMAL HIGH (ref 0.0–149.0)
VLDL: 50.2 mg/dL — ABNORMAL HIGH (ref 0.0–40.0)

## 2024-02-22 LAB — PSA, MEDICARE: PSA: 2.45 ng/mL (ref 0.10–4.00)

## 2024-02-22 MED ORDER — IBUPROFEN 800 MG PO TABS: 800.0000 mg | ORAL_TABLET | Freq: Three times a day (TID) | ORAL | 0 refills | Status: DC | PRN

## 2024-02-22 MED ORDER — TAMSULOSIN HCL 0.4 MG PO CAPS: 0.4000 mg | ORAL_CAPSULE | Freq: Every day | ORAL | 3 refills | Status: AC

## 2024-02-22 NOTE — Progress Notes (Signed)
 Hearing Screening - Comments:: Passed whisper test Vision Screening - Comments:: April 2025

## 2024-02-22 NOTE — Assessment & Plan Note (Signed)
 I have personally reviewed the Medicare Annual Wellness questionnaire and have noted 1. The patient's medical and social history 2. Their use of alcohol, tobacco or illicit drugs 3. Their current medications and supplements 4. The patient's functional ability including ADL's, fall risks, home safety risks and hearing or visual             impairment. 5. Diet and physical activities 6. Evidence for depression or mood disorders  The patients weight, height, BMI and visual acuity have been recorded in the chart I have made referrals, counseling and provided education to the patient based review of the above and I have provided the pt with a written personalized care plan for preventive services.  I have provided you with a copy of your personalized plan for preventive services. Please take the time to review along with your updated medication list.  Due for colonoscopy--working this out with his insurance Will check PSA Stays active with 2 jobs Considering shingrix Doesn't want COVID vaccine Recommended flu vaccine in the fall

## 2024-02-22 NOTE — Assessment & Plan Note (Signed)
 Mild symptoms Uses the albuterol prn Hasn't had success with cigarette cessation

## 2024-02-22 NOTE — Assessment & Plan Note (Signed)
 BP Readings from Last 3 Encounters:  02/22/24 132/76  01/03/24 126/82  01/01/24 (!) 147/83   Controlled with amlodipine 5mg  daily

## 2024-02-22 NOTE — Assessment & Plan Note (Addendum)
 If worsens, will start tamsulosin---actually he wants to try

## 2024-02-22 NOTE — Assessment & Plan Note (Signed)
 Uses ibuprofen rarely Discussed topical diclofenac and tylenol

## 2024-02-22 NOTE — Progress Notes (Signed)
 Subjective:    Patient ID: Patrick Chavez, male    DOB: [birth year], 68 y.o.   MRN: [id number]  HPI Here for Medicare wellness visit and follow up of chronic health conditions Reviewed advanced directives Reviewed other doctors---Dr Petery--podiatrist, New doctor--ophthal No hospitalizations or surgery in the past year. Multiple visits for respiratory infections Rare alcohol Back to smoking No exercise--but works daily and physically active Vision is okay Hearing is good No falls No depression or anhedonia Independent with instrumental ADLs No memory issues  Having some tremor in left hand Can't even hold things at times Goes back years People have noticed No trouble walking No stiffness No change in handwriting No known family tremor  Back to smoking Did stop for 3 days only Some SOB--not new. Uses inhaler prn-with exertion No regular cough  Some low back pain Ibuprofen helps--but only uses it once in a while Tylenol not as effective Still works 2 jobs  Gets some upper left chest pain---not exertional Relates to the cigarettes No dizziness or syncope No edema No palpitations  Known aortic atherosclerosis  Current Outpatient Medications on File Prior to Visit  Medication Sig Dispense Refill   acetaminophen (TYLENOL) 325 MG tablet Take 2 tablets (650 mg total) by mouth every 6 (six) hours as needed for moderate pain. 30 tablet 0   albuterol (VENTOLIN HFA) 108 (90 Base) MCG/ACT inhaler Inhale 1-2 puffs into the lungs every 4 (four) hours as needed for wheezing or shortness of breath. 8.5 each 0   amLODipine (NORVASC) 5 MG tablet Take 1 tablet (5 mg total) by mouth daily. 90 tablet 3   aspirin 81 MG tablet Take 1 tablet (81 mg total) by mouth daily. 30 tablet 1   Multiple Vitamin (MULTIVITAMIN WITH MINERALS) TABS Take 1 tablet by mouth daily.     ibuprofen (ADVIL) 800 MG tablet Take 1 tablet (800 mg total) by mouth every 8 (eight) hours as needed (pain). (Patient  not taking: Reported on 02/22/2024) 21 tablet 0   No current facility-administered medications on file prior to visit.    Allergies  Allergen Reactions   Tamsulosin Nausea Only    Past Medical History:  Diagnosis Date   BPH with obstruction/lower urinary tract symptoms    Hepatitis C last year   was treated   Hypertension     Past Surgical History:  Procedure Laterality Date   CATARACT EXTRACTION W/ INTRAOCULAR LENS IMPLANT Bilateral    TONSILLECTOMY      Family History  Problem Relation Age of Onset   Hypertension Mother    Diabetes Mother    Cancer Mother    Glaucoma Mother    Pancreatic cancer Mother    Cancer Father    Throat cancer Father    Stomach cancer Father    Hypertension Other     Social History   Socioeconomic History   Marital status: Married    Spouse name: Not on file   Number of children: 7   Years of education: Not on file   Highest education level: Not on file  Occupational History   Occupation: Warehouse work    Comment: retired   Occupation: Special needs supervision--adults    Comment: full time   Occupation: Journalist, newspaper: [employer]  Tobacco Use   Smoking status: Every Day    Current packs/day: 1.00    Types: Cigarettes    Passive exposure: Past   Smokeless tobacco: Never  Vaping Use   Vaping status:  Never Used  Substance and Sexual Activity   Alcohol use: Yes    Comment: Rare   Drug use: No   Sexual activity: Yes  Other Topics Concern   Not on file  Social History Narrative   3 children of his own   4 step children      No living will   Wife would be health care POA--then kids   Would accept resuscitaion   No tube feeds or ventilation if not cognitively unaware   Social Drivers of Health   Financial Resource Strain: Not on file  Food Insecurity: Not on file  Transportation Needs: Not on file  Physical Activity: Not on file  Stress: Not on file  Social Connections: Not on file  Intimate  Partner Violence: Not on file   Review of Systems Appetite is okay Weight went up--but now back down some Sleeps okay Wears seat belt Upper dentures--a few on bottom but doesn't see dentist Some heartburn--not regular. Uses tums at times. No dysphagia Bowels move fine--no blood Some knee pain also--especially left (from past sports) No suspicious skin lesions Voids okay--some frequency. Nocturia is variable    Objective:   Physical Exam Constitutional:      Appearance: Normal appearance.  HENT:     Mouth/Throat:     Pharynx: No oropharyngeal exudate or posterior oropharyngeal erythema.  Eyes:     Conjunctiva/sclera: Conjunctivae normal.     Pupils: Pupils are equal, round, and reactive to light.  Cardiovascular:     Rate and Rhythm: Normal rate and regular rhythm.     Pulses: Normal pulses.     Heart sounds: No murmur heard.    No gallop.  Pulmonary:     Effort: Pulmonary effort is normal.     Breath sounds: No wheezing or rales.     Comments: Decreased breath sounds but clear Abdominal:     Palpations: Abdomen is soft.     Tenderness: There is no abdominal tenderness.  Musculoskeletal:     Cervical back: Neck supple.     Right lower leg: No edema.     Left lower leg: No edema.  Lymphadenopathy:     Cervical: No cervical adenopathy.  Skin:    Findings: No lesion or rash.  Neurological:     General: No focal deficit present.     Mental Status: He is alert and oriented to person, place, and time.     Comments: Mini-cog--normal Mild left hand tremor--seems more intention then resting Gait--doesn't lift feet up much (he relates to knee issues) No bradykinesia or increased tone  Psychiatric:        Mood and Affect: Mood normal.        Behavior: Behavior normal.            Assessment & Plan:

## 2024-02-22 NOTE — Assessment & Plan Note (Signed)
 More noticeable Not really Parkinsonian--but not clearly familial He prefers referral to trial with meds so will set up with neurology

## 2024-02-22 NOTE — Assessment & Plan Note (Signed)
 On imaging He isn't excited about statins--discussed

## 2024-02-23 ENCOUNTER — Encounter: Payer: Self-pay | Admitting: Internal Medicine

## 2024-02-28 ENCOUNTER — Other Ambulatory Visit: Payer: Self-pay | Admitting: Internal Medicine

## 2024-03-01 NOTE — Progress Notes (Signed)
 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  - Discussed that about 30% of the time that essential tremor is asymmetric.  - Discussed medication, but both he and I agreed that this is not in his best interest right now, especially since it is the left hand that shakes and he is right-hand dominant and it is not that bothersome.  -check TSH since it has not been done in a while  - Follow-up as needed.  Subjective:   Patrick Chavez was seen today in the movement disorders clinic for neurologic consultation at the request of Helaine Llanos, MD.  The consultation is for the evaluation of intention tremor.  Tremor: Yes.     How long has it been going on? he remembers tremor as a child but worse now  At rest or with activation?  activation  When is it noted the most?  Grabbing cup of water; holding phone  Fam hx of tremor?  No.  Located where?  LUE (he is R hand dominant)  Affected by caffeine:  No. (drinks pepsi and coffee but has backed down on the pepsi)  Affected by alcohol:  doesn't drink any  Affected by stress:  No.  Affected by fatigue:  No.  Spills soup if on spoon:  no because he eats with the R hand  Tremor inducing meds:  Yes.   Albuterol  (uses rarely)   Other Specific Symptoms:  Voice: no trouble Postural symptoms:  some trouble when first wakes up due to "arthritis" - once up and has a cup of coffee, he does well.  Falls?  No. Loss of smell:  No. Loss of taste:  No. Urinary Incontinence:  has frequency - was given tamsulosin  but didn't take it yet (although its also listed under allergies) Handwriting, micrographia: No. But penmenship has never been good  Last neuroimaging of the brain was in 2016 MRI brain that was unremarkable.    ALLERGIES:   Allergies  Allergen Reactions   Tamsulosin  Nausea Only    CURRENT MEDICATIONS:  Current Outpatient Medications   Medication Instructions   acetaminophen  (TYLENOL ) 650 mg, Oral, Every 6 hours PRN   albuterol  (VENTOLIN  HFA) 108 (90 Base) MCG/ACT inhaler 1-2 puffs, Inhalation, Every 4 hours PRN   amLODipine  (NORVASC ) 5 mg, Oral, Daily   aspirin  81 mg, Oral, Daily   ibuprofen  (ADVIL ) 800 mg, Oral, Every 8 hours PRN   Multiple Vitamin (MULTIVITAMIN WITH MINERALS) TABS 1 tablet, Daily   tamsulosin  (FLOMAX ) 0.4 mg, Oral, Daily    Objective:   PHYSICAL EXAMINATION:    VITALS:   Vitals:   03/05/24 1021  BP: (!) 150/69  Pulse: 82  SpO2: 98%  Weight: 214 lb 6.4 oz (97.3 kg)  Height: 6\' 2"  (1.88 m)    GEN:  The patient appears stated age and is in NAD. HEENT:  Normocephalic, atraumatic.  The mucous membranes are moist. The superficial temporal arteries are without ropiness or tenderness. CV:  RRR Lungs:  CTAB Neck/HEME:  There are no carotid bruits bilaterally.  Neurological examination:  Orientation: The patient is alert and oriented x3.  Cranial nerves: There is good facial symmetry.  Extraocular muscles are intact. The visual fields are full to confrontational testing. The speech is fluent and clear. Soft palate rises symmetrically and there is no tongue deviation. Hearing is intact to conversational tone. Sensation: Sensation is intact to light touch throughout (facial, trunk,  extremities). Vibration is intact at the bilateral big toe. There is no extinction with double simultaneous stimulation.  Motor: Strength is 5/5 in the bilateral upper and lower extremities.   Shoulder shrug is equal and symmetric.  There is no pronator drift. Deep tendon reflexes: Deep tendon reflexes are 2-/4 at the bilateral biceps, triceps, brachioradialis, patella and achilles. Plantar responses are downgoing bilaterally.  Movement examination: Tone: There is nl tone in the bilateral upper extremities.  The tone in the lower extremities is nl.  Abnormal movements: He has no rest tremor, even with distraction  procedures.  He has no postural tremor.  He has minimal intention tremor, which is actually in the fingers of both hands.  He does have tremor on the left when he goes to put his AirPods away in the case.  There is not much tremor with Archimedes spirals.  He is able to pour water from 1 glass to another without spilling much of it, although there is mild tremor on the left.     Coordination:  There is no decremation with RAM's, with any form of RAMS, including alternating supination and pronation of the forearm, hand opening and closing, finger taps, heel taps and toe taps.  Gait and Station: The patient has no difficulty arising out of a deep-seated chair without the use of the hands. The patient's stride length is good.   I have reviewed and interpreted the following labs independently   Chemistry      Component Value Date/Time   NA 140 02/22/2024 1017   K 3.9 02/22/2024 1017   CL 106 02/22/2024 1017   CO2 26 02/22/2024 1017   BUN 11 02/22/2024 1017   CREATININE 0.97 02/22/2024 1017      Component Value Date/Time   CALCIUM  9.7 02/22/2024 1017   ALKPHOS 61 02/22/2024 1017   AST 17 02/22/2024 1017   ALT 12 02/22/2024 1017   BILITOT 0.5 02/22/2024 1017      No results found for: "TSH" Lab Results  Component Value Date   WBC 4.7 02/22/2024   HGB 13.6 02/22/2024   HCT 40.0 02/22/2024   MCV 93.8 02/22/2024   PLT 311.0 02/22/2024      Total time spent on today's visit was 45 minutes, including both face-to-face time and nonface-to-face time.  Time included that spent on review of records (prior notes available to me/labs/imaging if pertinent), discussing treatment and goals, answering patient's questions and coordinating care.  Cc:  Helaine Llanos, MD

## 2024-03-05 ENCOUNTER — Encounter: Payer: Self-pay | Admitting: Neurology

## 2024-03-05 ENCOUNTER — Ambulatory Visit: Admitting: Neurology

## 2024-03-05 ENCOUNTER — Other Ambulatory Visit

## 2024-03-05 VITALS — BP 150/69 | HR 82 | Ht 74.0 in | Wt 214.4 lb

## 2024-03-05 DIAGNOSIS — R251 Tremor, unspecified: Secondary | ICD-10-CM

## 2024-03-05 LAB — TSH: TSH: 0.94 m[IU]/L (ref 0.40–4.50)

## 2024-03-05 NOTE — Patient Instructions (Signed)
Your provider has requested that you have labwork completed today. The lab is located on the Second floor at [address], within the  Endocrinology office. When you get off the elevator, turn right and go in the  Endocrinology [address]; the first brown door on the left.  Tell the ladies behind the desk that you are there for lab work. If you are not called within 15 minutes please check with the front desk.   Once you complete your labs you are free to go. You will receive a call or message via MyChart with your lab results.    

## 2024-03-06 ENCOUNTER — Encounter: Payer: Self-pay | Admitting: Neurology

## 2024-04-24 DIAGNOSIS — H401131 Primary open-angle glaucoma, bilateral, mild stage: Secondary | ICD-10-CM | POA: Diagnosis not present

## 2024-05-01 DIAGNOSIS — H401121 Primary open-angle glaucoma, left eye, mild stage: Secondary | ICD-10-CM | POA: Diagnosis not present

## 2024-07-03 DIAGNOSIS — H401131 Primary open-angle glaucoma, bilateral, mild stage: Secondary | ICD-10-CM | POA: Diagnosis not present

## 2024-07-17 ENCOUNTER — Ambulatory Visit: Payer: Self-pay

## 2024-07-17 ENCOUNTER — Encounter: Payer: Self-pay | Admitting: Family Medicine

## 2024-07-17 ENCOUNTER — Ambulatory Visit (INDEPENDENT_AMBULATORY_CARE_PROVIDER_SITE_OTHER): Admitting: Family Medicine

## 2024-07-17 VITALS — BP 120/70 | HR 96 | Temp 97.7°F | Ht 74.25 in | Wt 206.5 lb

## 2024-07-17 DIAGNOSIS — R42 Dizziness and giddiness: Secondary | ICD-10-CM | POA: Insufficient documentation

## 2024-07-17 NOTE — Telephone Encounter (Signed)
 FYI Only or Action Required?: Action required by provider: request for appointment.  Patient was last seen in primary care on 02/22/2024 by Jimmy Charlie FERNS, MD.  Called Nurse Triage reporting Dizziness.  Symptoms began several days ago.  Interventions attempted: Nothing.  Symptoms are: unchanged.  Triage Disposition: See Physician Within 24 Hours  Patient/caregiver understands and will follow disposition?: YesCopied from CRM #[id number]. Topic: Clinical - Red Word Triage >> Jul 17, 2024  8:29 AM Mesmerise C wrote: [facility] that prompted transfer to Nurse Triage: Patient stated he's been light headed along with a headache for 3 days stated wants to check if it's because of his BP or vertigo Reason for Disposition  [1] MODERATE dizziness (e.g., interferes with normal activities) AND [2] has NOT been evaluated by doctor (or NP/PA) for this  (Exception: Dizziness caused by heat exposure, sudden standing, or poor fluid intake.)  Answer Assessment - Initial Assessment Questions Pt has hx of vertigo but hasn't had it in a long time.     1. DESCRIPTION: Describe your dizziness.     Like Imgoing to pass out at times 2. LIGHTHEADED: Do you feel lightheaded? (e.g., somewhat faint, woozy, weak upon standing)     yes 3. VERTIGO: Do you feel like either you or the room is spinning or tilting? (i.e., vertigo)     Head is spinning 4. SEVERITY: How bad is it?  Do you feel like you are going to faint? Can you stand and walk?     5 5. ONSET:  When did the dizziness begin?     3 days ago  6. AGGRAVATING FACTORS: Does anything make it worse? (e.g., standing, change in head position)     Movement  7. HEART RATE: Can you tell me your heart rate? How many beats in 15 seconds?  (Note: Not all patients can do this.)       na 8. CAUSE: What do you think is causing the dizziness? (e.g., decreased fluids or food, diarrhea, emotional distress, heat exposure, new medicine, sudden  standing, vomiting; unknown)     Returning vertigo  9. was the last time? What happened that time?     Yesterday  10. OTHER SYMPTOMS: Do you have any other symptoms? (e.g., fever, chest pain, vomiting, diarrhea, bleeding)       denies  Protocols used: Dizziness - Lightheadedness-A-AH

## 2024-07-17 NOTE — Patient Instructions (Signed)
 Can try meclizine over the counter if needed for severe vertigo.  Start home desensitization exercises. Call if not improving in 2 weeks for possible referral to PT for balance retraining.  Please stop at the lab to have labs drawn.

## 2024-07-17 NOTE — Assessment & Plan Note (Signed)
 Acute, patient's symptoms correspond most closely with benign paroxysmal positional vertigo although there does seem to be some possible orthostatic hypotension component. Will evaluate with labs given this inconsistency. Patient without red flags for CVA or TIA.  Neurologic exam normal.  Patient encouraged to quit smoking.  Patient given home desensitization exercises to begin.  And use meclizine as needed.  If not improving as expected he will call for referral to physical therapy for further balance retraining

## 2024-07-17 NOTE — Telephone Encounter (Signed)
 Patrick Chavez

## 2024-07-17 NOTE — Telephone Encounter (Signed)
 Per appt notes pt already has appt witih Dr Avelina on 07/17/24 at [facility]. Sending note to Dr Avelina Craw pool.

## 2024-07-17 NOTE — Telephone Encounter (Signed)
FYI for appt today

## 2024-07-17 NOTE — Progress Notes (Signed)
 Patient ID: Patrick Chavez, male    DOB: Aug 26, 1956, 69 y.o.   MRN: [id number]  This visit was conducted in person.  BP 120/70   Pulse 96   Temp 97.7 F (36.5 C) (Temporal)   Ht 6' 2.25 (1.886 m)   Wt 206 lb 8 oz (93.7 kg)   SpO2 95%   BMI 26.33 kg/m    CC:  Chief Complaint  Patient presents with   Dizziness    Subjective:   HPI: Patrick Chavez is a 68 y.o. male with history of hypertension, emphysema presenting on 07/17/2024 for Dizziness  He presents with new onset  lightheadedness, room spinning.  Feel better with lying down.  Trigger with moving head side to side.   Mild nausea with it.  No vomiting, no fever.  No CP, no SOB, no  palpitations.   Has had vertigo in past.  Slight headache... use tylenol .  Per pt  on his old cuff at home 140-170/103 BP Readings from Last 3 Encounters:  07/17/24 120/70  03/05/24 (!) 150/69  02/22/24 132/76    No new meds.     Recent tremor evaluated by neurology on March 06, 2019 for.  Dr.  Evonnie Madison Parkinson's unlikely.  Relevant past medical, surgical, family and social history reviewed and updated as indicated. Interim medical history since our last visit reviewed. Allergies and medications reviewed and updated. Outpatient Medications Prior to Visit  Medication Sig Dispense Refill   acetaminophen  (TYLENOL ) 325 MG tablet Take 2 tablets (650 mg total) by mouth every 6 (six) hours as needed for moderate pain. 30 tablet 0   albuterol  (VENTOLIN  HFA) 108 (90 Base) MCG/ACT inhaler Inhale 1-2 puffs into the lungs every 4 (four) hours as needed for wheezing or shortness of breath. 8.5 each 0   amLODipine  (NORVASC ) 5 MG tablet TAKE 1 TABLET (5 MG TOTAL) BY MOUTH DAILY. 90 tablet 3   aspirin  81 MG tablet Take 1 tablet (81 mg total) by mouth daily. 30 tablet 1   ibuprofen  (ADVIL ) 800 MG tablet Take 1 tablet (800 mg total) by mouth every 8 (eight) hours as needed (pain). 100 tablet 0   Multiple Vitamin (MULTIVITAMIN WITH MINERALS) TABS  Take 1 tablet by mouth daily.     tamsulosin  (FLOMAX ) 0.4 MG CAPS capsule Take 1 capsule (0.4 mg total) by mouth daily. 90 capsule 3   No facility-administered medications prior to visit.     Per HPI unless specifically indicated in ROS section below Review of Systems  Constitutional:  Negative for fatigue and fever.  HENT:  Negative for ear pain.   Eyes:  Negative for pain.  Respiratory:  Negative for cough and shortness of breath.   Cardiovascular:  Negative for chest pain, palpitations and leg swelling.  Gastrointestinal:  Negative for abdominal pain.  Genitourinary:  Negative for dysuria.  Musculoskeletal:  Negative for arthralgias.  Neurological:  Positive for dizziness and headaches. Negative for seizures, syncope, speech difficulty, weakness and numbness.  Psychiatric/Behavioral:  Negative for dysphoric mood.    Objective:  BP 120/70   Pulse 96   Temp 97.7 F (36.5 C) (Temporal)   Ht 6' 2.25 (1.886 m)   Wt 206 lb 8 oz (93.7 kg)   SpO2 95%   BMI 26.33 kg/m   Wt Readings from Last 3 Encounters:  07/17/24 206 lb 8 oz (93.7 kg)  03/05/24 214 lb 6.4 oz (97.3 kg)  02/22/24 208 lb (94.3 kg)      Physical Exam Vitals  reviewed.  Constitutional:      Appearance: He is well-developed.  HENT:     Head: Normocephalic.     Right Ear: Hearing normal.     Left Ear: Hearing normal.     Nose: Nose normal.  Neck:     Thyroid : No thyroid  mass or thyromegaly.     Vascular: No carotid bruit.     Trachea: Trachea normal.  Cardiovascular:     Rate and Rhythm: Normal rate and regular rhythm.     Pulses: Normal pulses.     Heart sounds: Heart sounds not distant. No murmur heard.    No friction rub. No gallop.     Comments: No peripheral edema Pulmonary:     Effort: Pulmonary effort is normal. No respiratory distress.     Breath sounds: Normal breath sounds.  Skin:    General: Skin is warm and dry.     Findings: No rash.  Neurological:     Mental Status: He is alert and  oriented to person, place, and time.     Cranial Nerves: Cranial nerves 2-12 are intact.     Sensory: Sensation is intact.     Motor: Motor function is intact.     Gait: Gait is intact. Gait and tandem walk normal.     Comments: Nml cerebellar exam Patient does report woozy feeling going sitting to standing but room spinning with turning head Positive Dix-Hallpike maneuver in office.  Psychiatric:        Speech: Speech normal.        Behavior: Behavior normal.        Thought Content: Thought content normal.       Results for orders placed or performed in visit on 03/05/24  TSH   Collection Time: 03/05/24 10:56 AM  Result Value Ref Range   TSH 0.94 0.40 - 4.50 mIU/L    Assessment and Plan  Episodic lightheadedness Assessment & Plan: Acute, patient's symptoms correspond most closely with benign paroxysmal positional vertigo although there does seem to be some possible orthostatic hypotension component. Will evaluate with labs given this inconsistency. Patient without red flags for CVA or TIA.  Neurologic exam normal.  Patient encouraged to quit smoking.  Patient given home desensitization exercises to begin.  And use meclizine as needed.  If not improving as expected he will call for referral to physical therapy for further balance retraining  Orders: -     CBC with Differential/Platelet -     Vitamin B12 -     TSH -     Comprehensive metabolic panel with GFR    No follow-ups on file.   [name], MD

## 2024-07-18 ENCOUNTER — Ambulatory Visit: Payer: Self-pay | Admitting: Family Medicine

## 2024-07-18 LAB — COMPREHENSIVE METABOLIC PANEL WITH GFR
ALT: 12 U/L (ref 0–53)
AST: 19 U/L (ref 0–37)
Albumin: 4.2 g/dL (ref 3.5–5.2)
Alkaline Phosphatase: 62 U/L (ref 39–117)
BUN: 9 mg/dL (ref 6–23)
CO2: 27 meq/L (ref 19–32)
Calcium: 9 mg/dL (ref 8.4–10.5)
Chloride: 104 meq/L (ref 96–112)
Creatinine, Ser: 0.93 mg/dL (ref 0.40–1.50)
GFR: 84.96 mL/min (ref >60.00)
Glucose, Bld: 124 mg/dL — ABNORMAL HIGH (ref 70–99)
Potassium: 3.6 meq/L (ref 3.5–5.1)
Sodium: 140 meq/L (ref 135–145)
Total Bilirubin: 0.7 mg/dL (ref 0.2–1.2)
Total Protein: 7.3 g/dL (ref 6.0–8.3)

## 2024-07-18 LAB — CBC WITH DIFFERENTIAL/PLATELET
Basophils Absolute: 0.1 K/uL (ref 0.0–0.1)
Basophils Relative: 1.1 % (ref 0.0–3.0)
Eosinophils Absolute: 0.2 K/uL (ref 0.0–0.7)
Eosinophils Relative: 2.8 % (ref 0.0–5.0)
HCT: 38.1 % — ABNORMAL LOW (ref 39.0–52.0)
Hemoglobin: 12.9 g/dL — ABNORMAL LOW (ref 13.0–17.0)
Lymphocytes Relative: 35 % (ref 12.0–46.0)
Lymphs Abs: 1.9 K/uL (ref 0.7–4.0)
MCHC: 33.8 g/dL (ref 30.0–36.0)
MCV: 91.4 fl (ref 78.0–100.0)
Monocytes Absolute: 0.4 K/uL (ref 0.1–1.0)
Monocytes Relative: 7.3 % (ref 3.0–12.0)
Neutro Abs: 2.9 K/uL (ref 1.4–7.7)
Neutrophils Relative %: 53.8 % (ref 43.0–77.0)
Platelets: 280 K/uL (ref 150.0–400.0)
RBC: 4.17 Mil/uL — ABNORMAL LOW (ref 4.22–5.81)
RDW: 13.7 % (ref 11.5–15.5)
WBC: 5.5 K/uL (ref 4.0–10.5)

## 2024-07-18 LAB — TSH: TSH: 0.67 u[IU]/mL (ref 0.35–5.50)

## 2024-07-18 LAB — VITAMIN B12: Vitamin B-12: 567 pg/mL (ref 211–911)

## 2024-08-01 ENCOUNTER — Ambulatory Visit
Admission: EM | Admit: 2024-08-01 | Discharge: 2024-08-01 | Disposition: A | Discharge status: Home / Self Care | Attending: Family Medicine | Admitting: Family Medicine

## 2024-08-01 DIAGNOSIS — J438 Other emphysema: Secondary | ICD-10-CM | POA: Diagnosis not present

## 2024-08-01 DIAGNOSIS — J0141 Acute recurrent pansinusitis: Secondary | ICD-10-CM

## 2024-08-01 MED ORDER — PREDNISONE 20 MG PO TABS: ORAL_TABLET | ORAL | 0 refills | Status: DC

## 2024-08-01 MED ORDER — AMOXICILLIN-POT CLAVULANATE 875-125 MG PO TABS
1.0000 | ORAL_TABLET | Freq: Two times a day (BID) | ORAL | 0 refills | Status: DC
Start: 2024-08-01 — End: 2024-09-14

## 2024-08-01 MED ORDER — ALBUTEROL SULFATE HFA 108 (90 BASE) MCG/ACT IN AERS: 1.0000 | INHALATION_SPRAY | RESPIRATORY_TRACT | 0 refills | Status: DC | PRN

## 2024-08-01 NOTE — Discharge Instructions (Addendum)
 We will manage this as a recurrent sinus infection with amoxicillin -clavulanate. For sore throat or cough try using a honey-based tea. Use 3 teaspoons of honey with juice squeezed from half lemon. Place shaved pieces of ginger into 1/2-1 cup of water and warm over stove top. Then mix the ingredients and repeat every 4 hours as needed. Please take Tylenol  500mg -650mg  every 6 hours for throat pain, fevers, aches and pains. Hydrate very well with at least 2 liters of water. Eat light meals such as soups (chicken and noodles, vegetable, chicken and wild rice).  Do not eat foods that you are allergic to.  Taking an antihistamine like Zyrtec can help against postnasal drainage, sinus congestion which can cause sinus pain, sinus headaches, throat pain, painful swallowing, coughing.  You can take this together with prednisone  and albuterol .  Use cough medication as needed.

## 2024-08-01 NOTE — ED Triage Notes (Signed)
 Pt reports nasal congestion, chills, headache x 1 week. Tylenol  cold and flu gives some relief.

## 2024-08-01 NOTE — ED Provider Notes (Signed)
 [facility]  Note:  This document was prepared using Conservation officer, historic buildings and may include unintentional dictation errors.  MRN: [id number] DOB: 1956-02-12  Subjective:   Patrick Chavez is a 68 y.o. male with PMH of emphysema presenting for 1 week history of persistent and worsening illness.  Last 3 days has had more sinus congestion, drainage, chest heaviness, sinus pain.  Has chronic shortness of breath and wheezing in the albuterol  inhaler is providing very short-term relief.  Patient is still smoking, does 1ppd.   No current facility-administered medications for this encounter.  Current Outpatient Medications:    acetaminophen  (TYLENOL ) 325 MG tablet, Take 2 tablets (650 mg total) by mouth every 6 (six) hours as needed for moderate pain., Disp: 30 tablet, Rfl: 0   albuterol  (VENTOLIN  HFA) 108 (90 Base) MCG/ACT inhaler, Inhale 1-2 puffs into the lungs every 4 (four) hours as needed for wheezing or shortness of breath., Disp: 8.5 each, Rfl: 0   amLODipine  (NORVASC ) 5 MG tablet, TAKE 1 TABLET (5 MG TOTAL) BY MOUTH DAILY., Disp: 90 tablet, Rfl: 3   aspirin  81 MG tablet, Take 1 tablet (81 mg total) by mouth daily., Disp: 30 tablet, Rfl: 1   ibuprofen  (ADVIL ) 800 MG tablet, Take 1 tablet (800 mg total) by mouth every 8 (eight) hours as needed (pain)., Disp: 100 tablet, Rfl: 0   Multiple Vitamin (MULTIVITAMIN WITH MINERALS) TABS, Take 1 tablet by mouth daily., Disp: , Rfl:    tamsulosin  (FLOMAX ) 0.4 MG CAPS capsule, Take 1 capsule (0.4 mg total) by mouth daily., Disp: 90 capsule, Rfl: 3   Allergies  Allergen Reactions   Tamsulosin  Nausea Only    Past Medical History:  Diagnosis Date   BPH with obstruction/lower urinary tract symptoms    Hepatitis C last year   was treated   Hypertension      Past Surgical History:  Procedure Laterality Date   CATARACT EXTRACTION W/ INTRAOCULAR LENS IMPLANT Bilateral    TONSILLECTOMY      Family History   Problem Relation Age of Onset   Hypertension Mother    Diabetes Mother    Cancer Mother    Glaucoma Mother    Pancreatic cancer Mother    Cancer Father    Throat cancer Father    Stomach cancer Father    Hypertension Other     Social History   Tobacco Use   Smoking status: Every Day    Current packs/day: 1.00    Types: Cigarettes    Passive exposure: Past   Smokeless tobacco: Never  Vaping Use   Vaping status: Never Used  Substance Use Topics   Alcohol use: Yes    Comment: Rare   Drug use: No    ROS   Objective:   Vitals: BP (!) 147/88 (BP Location: Right Arm)   Pulse 88   Temp 97.9 F (36.6 C) (Oral)   Resp 18   SpO2 94%   Physical Exam Constitutional:      General: He is not in acute distress.    Appearance: Normal appearance. He is well-developed and normal weight. He is not ill-appearing, toxic-appearing or diaphoretic.  HENT:     Head: Normocephalic and atraumatic.     Right Ear: Tympanic membrane, ear canal and external ear normal. No drainage, swelling or tenderness. No middle ear effusion. There is no impacted cerumen. Tympanic membrane is not erythematous or bulging.     Left Ear: Tympanic membrane, ear canal and external  ear normal. No drainage, swelling or tenderness.  No middle ear effusion. There is no impacted cerumen. Tympanic membrane is not erythematous or bulging.     Nose: Congestion present. No rhinorrhea.     Mouth/Throat:     Mouth: Mucous membranes are moist.     Pharynx: No oropharyngeal exudate or posterior oropharyngeal erythema.  Eyes:     General: No scleral icterus.       Right eye: No discharge.        Left eye: No discharge.     Extraocular Movements: Extraocular movements intact.     Conjunctiva/sclera: Conjunctivae normal.  Cardiovascular:     Rate and Rhythm: Normal rate and regular rhythm.     Heart sounds: Normal heart sounds. No murmur heard.    No friction rub. No gallop.  Pulmonary:     Effort: Pulmonary effort  is normal. No respiratory distress.     Breath sounds: No stridor. Rhonchi present. No wheezing or rales.  Musculoskeletal:     Cervical back: Normal range of motion and neck supple. No rigidity. No muscular tenderness.  Neurological:     General: No focal deficit present.     Mental Status: He is alert and oriented to person, place, and time.  Psychiatric:        Mood and Affect: Mood normal.        Behavior: Behavior normal.        Thought Content: Thought content normal.     Assessment and Plan :   PDMP not reviewed this encounter.  1. Acute recurrent pansinusitis   2. Other emphysema (HCC)    Will manage for recurrent pansinusitis with Augmentin  and acute flare of his emphysema with prednisone .  Refilled his albuterol  inhaler.  Use supportive care otherwise.  Will defer imaging for now.  Counseled patient on potential for adverse effects with medications prescribed/recommended today, ER and return-to-clinic precautions discussed, patient verbalized understanding.    Christopher Savannah, [place] 08/01/24 [phone]

## 2024-08-07 ENCOUNTER — Encounter: Admitting: General Practice

## 2024-08-07 DIAGNOSIS — J439 Emphysema, unspecified: Secondary | ICD-10-CM | POA: Diagnosis not present

## 2024-08-07 DIAGNOSIS — R509 Fever, unspecified: Secondary | ICD-10-CM | POA: Diagnosis not present

## 2024-08-07 DIAGNOSIS — J019 Acute sinusitis, unspecified: Secondary | ICD-10-CM | POA: Diagnosis not present

## 2024-08-07 DIAGNOSIS — R051 Acute cough: Secondary | ICD-10-CM | POA: Diagnosis not present

## 2024-08-07 DIAGNOSIS — Z03818 Encounter for observation for suspected exposure to other biological agents ruled out: Secondary | ICD-10-CM | POA: Diagnosis not present

## 2024-09-04 ENCOUNTER — Other Ambulatory Visit: Payer: Self-pay

## 2024-09-04 MED ORDER — IBUPROFEN 800 MG PO TABS: 800.0000 mg | ORAL_TABLET | Freq: Three times a day (TID) | ORAL | 0 refills | Status: AC | PRN

## 2024-09-14 ENCOUNTER — Ambulatory Visit: Admitting: General Practice

## 2024-09-14 ENCOUNTER — Encounter: Payer: Self-pay | Admitting: General Practice

## 2024-09-14 VITALS — BP 122/84 | HR 81 | Temp 98.6°F | Ht 75.0 in | Wt 206.0 lb

## 2024-09-14 DIAGNOSIS — I10 Essential (primary) hypertension: Secondary | ICD-10-CM

## 2024-09-14 DIAGNOSIS — M150 Primary generalized (osteo)arthritis: Secondary | ICD-10-CM

## 2024-09-14 DIAGNOSIS — E782 Mixed hyperlipidemia: Secondary | ICD-10-CM | POA: Diagnosis not present

## 2024-09-14 DIAGNOSIS — E559 Vitamin D deficiency, unspecified: Secondary | ICD-10-CM | POA: Insufficient documentation

## 2024-09-14 DIAGNOSIS — Z7689 Persons encountering health services in other specified circumstances: Secondary | ICD-10-CM | POA: Diagnosis not present

## 2024-09-14 DIAGNOSIS — Z1211 Encounter for screening for malignant neoplasm of colon: Secondary | ICD-10-CM

## 2024-09-14 DIAGNOSIS — I700 Atherosclerosis of aorta: Secondary | ICD-10-CM | POA: Diagnosis not present

## 2024-09-14 DIAGNOSIS — R251 Tremor, unspecified: Secondary | ICD-10-CM

## 2024-09-14 DIAGNOSIS — N401 Enlarged prostate with lower urinary tract symptoms: Secondary | ICD-10-CM

## 2024-09-14 DIAGNOSIS — N138 Other obstructive and reflux uropathy: Secondary | ICD-10-CM

## 2024-09-14 DIAGNOSIS — H409 Unspecified glaucoma: Secondary | ICD-10-CM | POA: Insufficient documentation

## 2024-09-14 DIAGNOSIS — Z8619 Personal history of other infectious and parasitic diseases: Secondary | ICD-10-CM | POA: Insufficient documentation

## 2024-09-14 DIAGNOSIS — F17200 Nicotine dependence, unspecified, uncomplicated: Secondary | ICD-10-CM | POA: Insufficient documentation

## 2024-09-14 DIAGNOSIS — J439 Emphysema, unspecified: Secondary | ICD-10-CM

## 2024-09-14 MED ORDER — ROSUVASTATIN CALCIUM 5 MG PO TABS
2.5000 mg | ORAL_TABLET | Freq: Every day | ORAL | 0 refills | Status: AC
Start: 2024-09-14 — End: ?

## 2024-09-14 NOTE — Assessment & Plan Note (Signed)
 EMR reviewed briefly.

## 2024-09-14 NOTE — Patient Instructions (Addendum)
 Continue medications as prescribed.   You will either be contacted via phone regarding your referral to GI , or you may receive a letter on your MyChart portal from our referral team with instructions for scheduling an appointment. Please let us  know if you have not been contacted by anyone within two weeks.   Follow up in April, 2025 for physical and fasting labs.   It was a pleasure meeting you!

## 2024-09-14 NOTE — Progress Notes (Signed)
 New Patient Office Visit  Subjective    Patient ID: [name], male    DOB: Apr 19, 1956  Age: 68 y.o. MRN: [id number]  CC:  Chief Complaint  Patient presents with   New Patient (Initial Visit)    TOC from Dr. Jimmy    HPI Patrick Chavez is a 68 y.o. male presents to establish care.  Previous PCP/physical/labs: Dr. Jimmy.   Discussed the use of AI scribe software for clinical note transcription with the patient, who gave verbal consent to proceed.  History of Present Illness Patrick Chavez is a 68 year old male who presents for a transfer of care appointment.  He has a history of glaucoma and follows up with an ophthalmologist annually. He reports he was previously on eye drops but stopped them after having laser treatment and cataract surgery. He currently uses glasses for reading due to difficulty seeing small print.  He has hypertension and is currently taking amlodipine  5 mg daily, which he takes in the morning before 11 AM. He monitors his blood pressure at home and recently purchased a new cuff. He occasionally experiences vertigo, and reports that it went away after about a week the last time. No ankle swelling with amlodipine  use.  He has emphysema and continues to smoke approximately a pack of cigarettes per day. He has attempted to quit using patches and lozenges but has not been successful.   He has a history of aortic atherosclerosis and takes a baby aspirin  daily. His cholesterol levels were last checked in April 2025, showing good total cholesterol and LDL levels, but high triglycerides and low HDL.   He has a history of osteoarthritis and uses ibuprofen  as needed for pain management. He was previously prescribed tamsulosin  for urinary issues but stopped taking it after experiencing dizziness. He is considering taking it at night to minimize side effects.  He has an essential tremor in his left hand, which is bothersome and embarrassing, especially when  handling money. He has been evaluated by neurology, who ruled out Parkinson's disease and did not recommend medication at this time.  He is due for a colonoscopy, having had his last one 10-12 years ago, which revealed polyps. He is also up to date with his shingles vaccine.    Outpatient Encounter Medications as of 09/14/2024  Medication Sig   albuterol  (VENTOLIN  HFA) 108 (90 Base) MCG/ACT inhaler Inhale 1-2 puffs into the lungs every 4 (four) hours as needed for wheezing or shortness of breath.   amLODipine  (NORVASC ) 5 MG tablet TAKE 1 TABLET (5 MG TOTAL) BY MOUTH DAILY.   aspirin  81 MG tablet Take 1 tablet (81 mg total) by mouth daily.   ibuprofen  (ADVIL ) 800 MG tablet Take 1 tablet (800 mg total) by mouth every 8 (eight) hours as needed (pain).   Multiple Vitamin (MULTIVITAMIN WITH MINERALS) TABS Take 1 tablet by mouth daily.   rosuvastatin  (CRESTOR ) 5 MG tablet Take 0.5 tablets (2.5 mg total) by mouth daily.   tamsulosin  (FLOMAX ) 0.4 MG CAPS capsule Take 1 capsule (0.4 mg total) by mouth daily.   [DISCONTINUED] acetaminophen  (TYLENOL ) 325 MG tablet Take 2 tablets (650 mg total) by mouth every 6 (six) hours as needed for moderate pain.   [DISCONTINUED] amoxicillin -clavulanate (AUGMENTIN ) 875-125 MG tablet Take 1 tablet by mouth 2 (two) times daily.   [DISCONTINUED] predniSONE  (DELTASONE ) 20 MG tablet Take 2 tablets daily with breakfast.   No facility-administered encounter medications on file as of 09/14/2024.    Past Medical History:  Diagnosis Date   BPH with obstruction/lower urinary tract symptoms    Hepatitis C last year   was treated   Hypertension     Past Surgical History:  Procedure Laterality Date   CATARACT EXTRACTION W/ INTRAOCULAR LENS IMPLANT Bilateral    TONSILLECTOMY      Family History  Problem Relation Age of Onset   Hypertension Mother    Diabetes Mother    Cancer Mother    Glaucoma Mother    Pancreatic cancer Mother    Cancer Father    Throat cancer  Father    Stomach cancer Father    Hypertension Other     Social History   Socioeconomic History   Marital status: Married    Spouse name: Not on file   Number of children: 7   Years of education: Not on file   Highest education level: Not on file  Occupational History   Occupation: Warehouse work    Comment: retired   Occupation: Special needs supervision--adults    Comment: full time   Occupation: Journalist, Newspaper: [employer]  Tobacco Use   Smoking status: Every Day    Current packs/day: 1.00    Types: Cigarettes    Passive exposure: Past   Smokeless tobacco: Never  Vaping Use   Vaping status: Never Used  Substance and Sexual Activity   Alcohol use: Yes    Comment: Rare   Drug use: No   Sexual activity: Yes  Other Topics Concern   Not on file  Social History Narrative   3 children of his own   4 step children      No living will   Wife would be health care POA--then kids   Would accept resuscitaion   No tube feeds or ventilation if not cognitively unaware   Right handed    Social Drivers of Corporate Investment Banker Strain: Not on file  Food Insecurity: Not on file  Transportation Needs: Not on file  Physical Activity: Not on file  Stress: Not on file  Social Connections: Not on file  Intimate Partner Violence: Not on file    Review of Systems  Constitutional:  Negative for chills and fever.  Respiratory:  Negative for shortness of breath.   Cardiovascular:  Negative for chest pain.  Gastrointestinal:  Negative for abdominal pain, constipation, diarrhea, heartburn, nausea and vomiting.  Genitourinary:  Negative for dysuria, frequency and urgency.  Neurological:  Negative for dizziness and headaches.  Endo/Heme/Allergies:  Negative for polydipsia.  Psychiatric/Behavioral:  Negative for depression and suicidal ideas. The patient is not nervous/anxious.         Objective    BP 122/84   Pulse 81   Temp 98.6 F (37 C) (Temporal)    Ht 6' 3 (1.905 m)   Wt 206 lb (93.4 kg)   SpO2 96%   BMI 25.75 kg/m   Physical Exam Vitals and nursing note reviewed.  Constitutional:      Appearance: Normal appearance.  Cardiovascular:     Rate and Rhythm: Normal rate and regular rhythm.     Pulses: Normal pulses.     Heart sounds: Normal heart sounds.  Pulmonary:     Effort: Pulmonary effort is normal.     Breath sounds: Normal breath sounds.  Neurological:     Mental Status: He is alert and oriented to person, place, and time.  Psychiatric:        Mood and Affect: Mood normal.  Behavior: Behavior normal.        Thought Content: Thought content normal.        Judgment: Judgment normal.         Assessment & Plan:  Mixed hyperlipidemia -     Rosuvastatin  Calcium ; Take 0.5 tablets (2.5 mg total) by mouth daily.  Dispense: 90 tablet; Refill: 0  Establishing care with new doctor, encounter for Assessment & Plan: EMR reviewed briefly.     Aortic atherosclerosis -     Rosuvastatin  Calcium ; Take 0.5 tablets (2.5 mg total) by mouth daily.  Dispense: 90 tablet; Refill: 0   Assessment and Plan Assessment & Plan Emphysema Chronic emphysema with ongoing smoking habit. Previous attempts with patches were unsuccessful. - Encouraged smoking cessation using 1-800-QUIT-NOW for free resources. - Discussed gradual reduction strategy by removing two cigarettes from daily pack. - Review herbal or alternative smoking cessation aids for interactions with current medications.  Aortic atherosclerosis and mixed hyperlipidemia Aortic atherosclerosis with mixed hyperlipidemia. Triglycerides elevated at 250 mg/dL and HDL low. Agreed to try Crestor . - Initiate Crestor  2.5 mg daily by taking half of a 5 mg tablet. - Continue aspirin  therapy.   Hypertension Chronic hypertension managed with amlodipine  5 mg daily. Blood pressure readings variable, potentially due to faulty home blood pressure cuff. - Continue amlodipine  5 mg  daily. - Monitor blood pressure regularly and report any significant changes.  Benign prostatic hyperplasia with lower urinary tract symptoms Benign prostatic hyperplasia with incomplete bladder emptying. Considering restarting tamsulosin  with nighttime dosing to minimize dizziness. - Restart tamsulosin  with nighttime dosing to minimize dizziness.  Essential tremor, left hand Chronic essential tremor in the left hand, previously evaluated by neurology with no evidence of Parkinson's disease. - Consider follow-up with neurology if tremor worsens or significantly impacts quality of life.  Osteoarthritis Chronic osteoarthritis managed with ibuprofen  as needed. - Continue ibuprofen  as needed for pain management. - Consider Voltaren or Tylenol  as alternative options.  Screening for malignant neoplasm of colon Due for colonoscopy screening. Previous colonoscopy over 10 years ago with history of polyps. - Refer to GI for colonoscopy at a new facility.    Return in about 23 weeks (around 02/22/2025) for physical and fasting labs. SABRA Carrol Aurora, NP

## 2024-10-01 ENCOUNTER — Encounter: Payer: Self-pay | Admitting: General Practice

## 2024-10-01 ENCOUNTER — Ambulatory Visit (INDEPENDENT_AMBULATORY_CARE_PROVIDER_SITE_OTHER): Admitting: General Practice

## 2024-10-01 VITALS — BP 138/82 | HR 81 | Temp 98.0°F | Ht 75.0 in | Wt 208.0 lb

## 2024-10-01 DIAGNOSIS — B9689 Other specified bacterial agents as the cause of diseases classified elsewhere: Secondary | ICD-10-CM

## 2024-10-01 DIAGNOSIS — J208 Acute bronchitis due to other specified organisms: Secondary | ICD-10-CM

## 2024-10-01 MED ORDER — ALBUTEROL SULFATE HFA 108 (90 BASE) MCG/ACT IN AERS
1.0000 | INHALATION_SPRAY | RESPIRATORY_TRACT | 0 refills | Status: AC | PRN
Start: 2024-10-01 — End: ?

## 2024-10-01 MED ORDER — BENZONATATE 200 MG PO CAPS: 200.0000 mg | ORAL_CAPSULE | Freq: Three times a day (TID) | ORAL | 0 refills | Status: DC | PRN

## 2024-10-01 MED ORDER — PREDNISONE 20 MG PO TABS: 40.0000 mg | ORAL_TABLET | Freq: Every day | ORAL | 0 refills | Status: AC

## 2024-10-01 NOTE — Patient Instructions (Addendum)
 Continue Doxycycline .   Start prednisone  20 mg tablets. Take 2 tablets by mouth once daily in the morning for 5 days.  Continue Albuterol  as needed.   You can try a few things over the counter to help with your symptoms including:  Cough: Delsym or Robitussin (get the off brand, works just as well) Chest Congestion: Mucinex (plain) Nasal Congestion/Ear Pressure/Sinus Pressure: Try using Flonase (fluticasone) nasal spray. Instill 1 spray in each nostril twice daily. This can be purchased over the counter. Body aches, fevers, headache: Ibuprofen  (not to exceed 2400 mg in 24 hours) or Acetaminophen -Tylenol  (not to exceed 3000 mg in 24 hours) Runny Nose/Throat Drainage/Sneezing/Itchy or Watery Eyes: An antihistamine such as Zyrtec, Claritin, Xyzal, Allegra  Increase water intake, rest, and use humidifier.   Update me if your symptoms are not better.   It was a pleasure to see you today!

## 2024-10-01 NOTE — Progress Notes (Signed)
 Established Patient Office Visit  Subjective   Patient ID: Patrick Chavez, male    DOB: 05-25-1956  Age: 68 y.o. MRN: [id number]  Chief Complaint  Patient presents with   Cough    With sore throat, body aches, chills since Thursday. Went to UC on Saturday and was given doxycycline  100 mg BID; did covid and flu tests and were negative. Before the abx patient was taking dayquil.     Cough Associated symptoms include chills and a sore throat. Pertinent negatives include no chest pain, ear pain, fever, headaches, heartburn or shortness of breath.    Patrick Chavez is a 68 year old male with past medical history of HTN, aortic atherosclerosis, empysema, OA, BPH, tremor presents today for an acute visit.   Discussed the use of AI scribe software for clinical note transcription with the patient, who gave verbal consent to proceed.  History of Present Illness Patrick Chavez is a 68 year old male who presents with persistent hoarseness, neck pain, and cold symptoms.  His symptoms began last Thursday with hoarseness, which has persisted. He also experiences neck pain and a sensation of coldness.  He visited urgent care on Saturday, where he was tested for flu and COVID-19, both of which were negative. He was prescribed doxycycline  100 mg twice daily for 10 days. He is currently on day three of the antibiotic treatment.  He has a productive cough. He has a history of sinus infections and bronchitis. He smokes a pack a day.    Patient Active Problem List   Diagnosis Date Noted   Establishing care with new doctor, encounter for 09/14/2024   Episodic lightheadedness 07/17/2024   Tremor 01/03/2024   Acute non-recurrent pansinusitis 10/18/2023   Preventative health care 02/21/2023   Pleuritic chest pain 01/12/2023   Emphysema lung (HCC) 12/06/2022   Aortic atherosclerosis 12/06/2022   BPH with obstruction/lower urinary tract symptoms 08/20/2022   Hypertension 08/20/2022   Smoker  08/20/2022   Osteoarthritis, multiple sites 08/20/2022   Past Medical History:  Diagnosis Date   BPH with obstruction/lower urinary tract symptoms    Glaucoma 09/14/2024   Hepatitis C last year   was treated   History of hepatitis C 09/14/2024   Hypertension    Tobacco dependence 09/14/2024   Vitamin D deficiency 09/14/2024   Past Surgical History:  Procedure Laterality Date   CATARACT EXTRACTION W/ INTRAOCULAR LENS IMPLANT Bilateral    TONSILLECTOMY     Social History   Tobacco Use   Smoking status: Every Day    Current packs/day: 1.00    Types: Cigarettes    Passive exposure: Past   Smokeless tobacco: Never  Vaping Use   Vaping status: Never Used  Substance Use Topics   Alcohol use: Yes    Comment: Rare   Drug use: No   Allergies  Allergen Reactions   Tamsulosin  Nausea Only         10/01/2024   10:01 AM 09/14/2024    8:22 AM 02/22/2024    9:56 AM  Depression screen PHQ 2/9  Decreased Interest 0 0 0  Down, Depressed, Hopeless 0 0 0  PHQ - 2 Score 0 0 0  Altered sleeping 0 0   Tired, decreased energy 0 0   Change in appetite 0 0   Feeling bad or failure about yourself  0 0   Trouble concentrating 0 0   Moving slowly or fidgety/restless 0 0   Suicidal thoughts 0 0   PHQ-9 Score 0  0    Difficult doing work/chores Not difficult at all Not difficult at all      Data saved with a previous flowsheet row definition       10/01/2024   10:01 AM 09/14/2024    8:23 AM  GAD 7 : Generalized Anxiety Score  Nervous, Anxious, on Edge 0 0  Control/stop worrying 0 0  Worry too much - different things 0 0  Trouble relaxing 0 0  Restless 0 0  Easily annoyed or irritable 0 0  Afraid - awful might happen 0 0  Total GAD 7 Score 0 0  Anxiety Difficulty Not difficult at all Not difficult at all      Review of Systems  Constitutional:  Positive for chills. Negative for fever.  HENT:  Positive for congestion and sore throat. Negative for ear pain.   Respiratory:   Positive for cough and sputum production. Negative for shortness of breath.   Cardiovascular:  Negative for chest pain.  Gastrointestinal:  Negative for abdominal pain, constipation, diarrhea, heartburn, nausea and vomiting.  Genitourinary:  Negative for dysuria, frequency and urgency.  Neurological:  Negative for dizziness and headaches.  Endo/Heme/Allergies:  Negative for polydipsia.  Psychiatric/Behavioral:  Negative for depression and suicidal ideas. The patient is not nervous/anxious.       Objective:     BP 138/82   Pulse 81   Temp 98 F (36.7 C) (Oral)   Ht 6' 3 (1.905 m)   Wt 208 lb (94.3 kg)   SpO2 98%   BMI 26.00 kg/m  BP Readings from Last 3 Encounters:  10/01/24 138/82  09/14/24 122/84  08/01/24 (!) 147/88   Wt Readings from Last 3 Encounters:  10/01/24 208 lb (94.3 kg)  09/14/24 206 lb (93.4 kg)  07/17/24 206 lb 8 oz (93.7 kg)      Physical Exam Vitals and nursing note reviewed.  Constitutional:      Appearance: Normal appearance.  HENT:     Right Ear: Tympanic membrane, ear canal and external ear normal.     Left Ear: Tympanic membrane, ear canal and external ear normal.     Nose: Congestion present.     Mouth/Throat:     Pharynx: Posterior oropharyngeal erythema present.  Eyes:     Conjunctiva/sclera: Conjunctivae normal.  Cardiovascular:     Rate and Rhythm: Normal rate and regular rhythm.     Pulses: Normal pulses.     Heart sounds: Normal heart sounds.  Pulmonary:     Effort: Pulmonary effort is normal.     Breath sounds: Normal breath sounds. No wheezing or rhonchi.  Neurological:     Mental Status: He is alert and oriented to person, place, and time.  Psychiatric:        Mood and Affect: Mood normal.        Behavior: Behavior normal.        Thought Content: Thought content normal.        Judgment: Judgment normal.      No results found for any visits on 10/01/24.     The 10-year ASCVD risk score (Arnett DK, et al., 2019) is:  32.7%    Assessment & Plan:  Acute bacterial bronchitis -     predniSONE ; Take 2 tablets (40 mg total) by mouth daily for 5 days.  Dispense: 10 tablet; Refill: 0 -     Albuterol  Sulfate HFA; Inhale 1-2 puffs into the lungs every 4 (four) hours as needed for wheezing or shortness  of breath.  Dispense: 8.5 each; Refill: 0 -     Benzonatate; Take 1 capsule (200 mg total) by mouth 3 (three) times daily as needed. Cough  Dispense: 20 capsule; Refill: 0    Assessment and Plan Assessment & Plan Acute bronchitis in a patient with emphysema and tobacco use Acute bronchitis likely exacerbated by smoking and emphysema. Negative for flu and COVID-19. No signs of pneumonia. - Continue doxycycline  100 mg twice daily for 10 days. - Prescribed prednisone , 2 tablets in the morning for 5 days. - Prescribed albuterol  inhaler as needed for shortness of breath. - Increase water intake to thin mucus. - Use a humidifier at night. - Use Flonase nasal spray for sinus congestion. - Prescribed Tessalon Perles for cough, up to three times a day. - Advised rest and monitor symptoms. Consider chest x-ray if no improvement after antibiotics.    Return if symptoms worsen or fail to improve.    Carrol Aurora, NP

## 2024-10-15 ENCOUNTER — Encounter: Payer: Self-pay | Admitting: Family Medicine

## 2024-10-15 ENCOUNTER — Ambulatory Visit (INDEPENDENT_AMBULATORY_CARE_PROVIDER_SITE_OTHER): Admitting: Family Medicine

## 2024-10-15 VITALS — BP 130/72 | HR 84 | Temp 98.4°F | Ht 75.0 in | Wt 215.4 lb

## 2024-10-15 DIAGNOSIS — J189 Pneumonia, unspecified organism: Secondary | ICD-10-CM | POA: Diagnosis not present

## 2024-10-15 NOTE — Progress Notes (Signed)
 Cambelle Suchecki T. Lisa-Marie Rueger, MD, CAQ Sports Medicine [facility] at [facility] [address] [place], [address]  Phone: [phone]  FAX: [phone]  Emmert Roethler - 68 y.o. male  MRN [id number]  Date of Birth: [birth year]  Date: 10/15/2024  PCP: Vincente Shivers, NP  Referral: Vincente Shivers, NP  Chief Complaint  Patient presents with   Follow-up    Follow Up ED Visit for Pneumonia [facility])   Subjective:   Avyn Coate is a 68 y.o. very pleasant male patient with Body mass index is 26.92 kg/m. who presents with the following:  Discussed the use of AI scribe software for clinical note transcription with the patient, who gave verbal consent to proceed.  Depo-Medrol  and Doxy Augmentin  History of Present Illness Manpreet Strey is a 68 year old male who presents with persistent lung congestion and pulmonary infection.  He has been experiencing persistent lung congestion following a recent illness. He was released to return to work on Friday and notes an increase in his smoking habit since then.  He is currently using a rescue inhaler, albuterol , twice daily, once in the morning and once at night. He received a steroid injection on Friday and has been prescribed azithromycin  and Augmentin . He completed the steroid course and is continuing with the antibiotics.  He has been experiencing sleep disturbances, waking up at 2 AM every night since becoming ill. He reports coughing a little last night and has been using cough syrup to manage the symptoms. No ear pain and his throat feels good.     Review of Systems is noted in the HPI, as appropriate  Objective:   BP 130/72   Pulse 84   Temp 98.4 F (36.9 C) (Temporal)   Ht 6' 3 (1.905 m)   Wt 215 lb 6 oz (97.7 kg)   SpO2 95%   BMI 26.92 kg/m    Gen: WDWN, NAD. Globally Non-toxic HEENT: Throat clear, w/o exudate, R TM clear, L TM - good landmarks, No fluid present. rhinnorhea.  MMM Frontal  sinuses: NT Max sinuses: NT NECK: Anterior cervical  LAD is absent CV: RRR, No M/G/R, cap refill <2 sec PULM: Breathing comfortably in no respiratory distress. no wheezing, crackles, rhonchi   Laboratory and Imaging Data:  Assessment and Plan:     ICD-10-CM   1. Community acquired pneumonia, unspecified laterality  J18.9      Assessment & Plan Acute lower respiratory infection Improving symptoms. Lung congestion persists but is not severe. Recent chest x-ray was normal. Clinically dx with CAP. Azithromycin  and Augmentin  are prescribed. - Continue azithromycin  as prescribed (take two pills initially, then one pill daily for four days). - Continue Augmentin  for ten days, twice daily. - Use albuterol  inhaler as needed for coughing and respiratory symptoms.  Tobacco use disorder Smoking may worsen respiratory symptoms and delay recovery.  Medication Management during today's office visit: No orders of the defined types were placed in this encounter.  Medications Discontinued During This Encounter  Medication Reason   doxycycline  (VIBRA -TABS) 100 MG tablet Completed Course   benzonatate  (TESSALON ) 200 MG capsule Completed Course    Orders placed today for conditions managed today: No orders of the defined types were placed in this encounter.   Disposition: No follow-ups on file.  [employer] speech-to-text software was used for transcription in this dictation.  Possible transcriptional errors can occur using Animal nutritionist.   Signed,  Jacques DASEN. Tyshay Adee, MD   Outpatient  Encounter Medications as of 10/15/2024  Medication Sig   amoxicillin -clavulanate (AUGMENTIN ) 875-125 MG tablet Take 1 tablet by mouth 2 (two) times daily.   dorzolamide-timolol (COSOPT) 2-0.5 % ophthalmic solution Place 1 drop into the right eye 2 (two) times daily.   predniSONE  (DELTASONE ) 20 MG tablet take 3 tabs days 1-3; then 2 tabs days 4-6; then 1 tab days 7 and 8 Orally Once a day; Duration:  8 days   albuterol  (VENTOLIN  HFA) 108 (90 Base) MCG/ACT inhaler Inhale 1-2 puffs into the lungs every 4 (four) hours as needed for wheezing or shortness of breath.   amLODipine  (NORVASC ) 5 MG tablet TAKE 1 TABLET (5 MG TOTAL) BY MOUTH DAILY.   aspirin  81 MG tablet Take 1 tablet (81 mg total) by mouth daily.   ibuprofen  (ADVIL ) 800 MG tablet Take 1 tablet (800 mg total) by mouth every 8 (eight) hours as needed (pain).   Multiple Vitamin (MULTIVITAMIN WITH MINERALS) TABS Take 1 tablet by mouth daily.   rosuvastatin  (CRESTOR ) 5 MG tablet Take 0.5 tablets (2.5 mg total) by mouth daily.   tamsulosin  (FLOMAX ) 0.4 MG CAPS capsule Take 1 capsule (0.4 mg total) by mouth daily.   [DISCONTINUED] benzonatate  (TESSALON ) 200 MG capsule Take 1 capsule (200 mg total) by mouth 3 (three) times daily as needed. Cough   [DISCONTINUED] doxycycline  (VIBRA -TABS) 100 MG tablet Take 100 mg by mouth 2 (two) times daily.   No facility-administered encounter medications on file as of 10/15/2024.

## 2024-11-14 ENCOUNTER — Other Ambulatory Visit: Payer: Self-pay | Admitting: Acute Care

## 2024-11-14 DIAGNOSIS — Z122 Encounter for screening for malignant neoplasm of respiratory organs: Secondary | ICD-10-CM

## 2024-11-14 DIAGNOSIS — F17210 Nicotine dependence, cigarettes, uncomplicated: Secondary | ICD-10-CM

## 2024-11-14 DIAGNOSIS — Z87891 Personal history of nicotine dependence: Secondary | ICD-10-CM

## 2024-12-17 ENCOUNTER — Ambulatory Visit: Admission: RE | Admit: 2024-12-17 | Source: Ambulatory Visit

## 2024-12-18 ENCOUNTER — Ambulatory Visit: Admission: RE | Admit: 2024-12-18 | Discharge: 2024-12-18 | Attending: Acute Care | Admitting: Acute Care

## 2024-12-18 DIAGNOSIS — Z122 Encounter for screening for malignant neoplasm of respiratory organs: Secondary | ICD-10-CM

## 2024-12-18 DIAGNOSIS — F17210 Nicotine dependence, cigarettes, uncomplicated: Secondary | ICD-10-CM

## 2024-12-18 DIAGNOSIS — Z87891 Personal history of nicotine dependence: Secondary | ICD-10-CM

## 2024-12-19 ENCOUNTER — Other Ambulatory Visit: Payer: Self-pay

## 2024-12-19 DIAGNOSIS — Z122 Encounter for screening for malignant neoplasm of respiratory organs: Secondary | ICD-10-CM

## 2024-12-19 DIAGNOSIS — Z87891 Personal history of nicotine dependence: Secondary | ICD-10-CM

## 2024-12-19 DIAGNOSIS — F17210 Nicotine dependence, cigarettes, uncomplicated: Secondary | ICD-10-CM

## 2025-02-25 ENCOUNTER — Encounter: Admitting: General Practice
